# Patient Record
Sex: Female | Born: 1955 | Race: White | Hispanic: No | Marital: Married | Smoking: Former smoker
Health system: Southern US, Community
[De-identification: ages and names within clinical notes are randomized; demographics above are authoritative.]

## PROBLEM LIST (undated history)

## (undated) DIAGNOSIS — M1990 Unspecified osteoarthritis, unspecified site: Secondary | ICD-10-CM

## (undated) DIAGNOSIS — F329 Major depressive disorder, single episode, unspecified: Secondary | ICD-10-CM

## (undated) DIAGNOSIS — F32A Depression, unspecified: Secondary | ICD-10-CM

## (undated) DIAGNOSIS — I509 Heart failure, unspecified: Secondary | ICD-10-CM

## (undated) DIAGNOSIS — K529 Noninfective gastroenteritis and colitis, unspecified: Secondary | ICD-10-CM

## (undated) DIAGNOSIS — I10 Essential (primary) hypertension: Secondary | ICD-10-CM

---

## 1998-12-23 ENCOUNTER — Other Ambulatory Visit: Admission: RE | Admit: 1998-12-23 | Discharge: 1998-12-23 | Payer: Self-pay | Admitting: *Deleted

## 1999-01-14 ENCOUNTER — Encounter (INDEPENDENT_AMBULATORY_CARE_PROVIDER_SITE_OTHER): Payer: Self-pay

## 1999-01-14 ENCOUNTER — Other Ambulatory Visit: Admission: RE | Admit: 1999-01-14 | Discharge: 1999-01-14 | Payer: Self-pay | Admitting: Obstetrics and Gynecology

## 2002-07-15 ENCOUNTER — Emergency Department (HOSPITAL_COMMUNITY): Admission: EM | Admit: 2002-07-15 | Discharge: 2002-07-15 | Payer: Self-pay | Admitting: Emergency Medicine

## 2002-07-15 ENCOUNTER — Encounter: Payer: Self-pay | Admitting: Emergency Medicine

## 2002-12-07 ENCOUNTER — Other Ambulatory Visit: Admission: RE | Admit: 2002-12-07 | Discharge: 2002-12-07 | Payer: Self-pay | Admitting: Family Medicine

## 2003-09-26 ENCOUNTER — Observation Stay (HOSPITAL_COMMUNITY): Admission: EM | Admit: 2003-09-26 | Discharge: 2003-09-27 | Payer: Self-pay | Admitting: Emergency Medicine

## 2003-09-26 ENCOUNTER — Encounter: Admission: RE | Admit: 2003-09-26 | Discharge: 2003-09-26 | Payer: Self-pay | Admitting: Family Medicine

## 2004-03-11 ENCOUNTER — Ambulatory Visit: Payer: Self-pay | Admitting: Family Medicine

## 2004-03-30 ENCOUNTER — Encounter: Admission: RE | Admit: 2004-03-30 | Discharge: 2004-03-30 | Payer: Self-pay | Admitting: Family Medicine

## 2004-04-08 ENCOUNTER — Ambulatory Visit: Payer: Self-pay

## 2004-04-14 ENCOUNTER — Ambulatory Visit: Payer: Self-pay | Admitting: Family Medicine

## 2004-10-05 ENCOUNTER — Ambulatory Visit: Payer: Self-pay | Admitting: Family Medicine

## 2005-06-07 ENCOUNTER — Ambulatory Visit: Payer: Self-pay | Admitting: Family Medicine

## 2005-06-17 ENCOUNTER — Ambulatory Visit: Payer: Self-pay | Admitting: Family Medicine

## 2006-08-15 ENCOUNTER — Telehealth (INDEPENDENT_AMBULATORY_CARE_PROVIDER_SITE_OTHER): Payer: Self-pay | Admitting: *Deleted

## 2006-10-05 ENCOUNTER — Telehealth (INDEPENDENT_AMBULATORY_CARE_PROVIDER_SITE_OTHER): Payer: Self-pay | Admitting: *Deleted

## 2006-12-13 ENCOUNTER — Other Ambulatory Visit: Admission: RE | Admit: 2006-12-13 | Discharge: 2006-12-13 | Payer: Self-pay | Admitting: Family Medicine

## 2007-01-10 ENCOUNTER — Ambulatory Visit (HOSPITAL_COMMUNITY): Admission: RE | Admit: 2007-01-10 | Discharge: 2007-01-10 | Payer: Self-pay | Admitting: Family Medicine

## 2008-09-03 ENCOUNTER — Other Ambulatory Visit: Admission: RE | Admit: 2008-09-03 | Discharge: 2008-09-03 | Payer: Self-pay | Admitting: Family Medicine

## 2009-04-14 ENCOUNTER — Emergency Department (HOSPITAL_COMMUNITY): Admission: EM | Admit: 2009-04-14 | Discharge: 2009-04-14 | Payer: Self-pay | Admitting: Emergency Medicine

## 2009-09-23 ENCOUNTER — Other Ambulatory Visit: Admission: RE | Admit: 2009-09-23 | Discharge: 2009-09-23 | Payer: Self-pay | Admitting: Family Medicine

## 2010-02-28 ENCOUNTER — Encounter: Payer: Self-pay | Admitting: Family Medicine

## 2010-05-03 LAB — COMPREHENSIVE METABOLIC PANEL
ALT: 18 U/L (ref 0–35)
Alkaline Phosphatase: 64 U/L (ref 39–117)
BUN: 12 mg/dL (ref 6–23)
CO2: 21 mEq/L (ref 19–32)
Chloride: 98 mEq/L (ref 96–112)
GFR calc non Af Amer: >60 mL/min (ref >60)
Glucose, Bld: 173 mg/dL — ABNORMAL HIGH (ref 70–99)
Potassium: 3.9 mEq/L (ref 3.5–5.1)
Sodium: 132 mEq/L — ABNORMAL LOW (ref 135–145)
Total Bilirubin: 2.4 mg/dL — ABNORMAL HIGH (ref 0.3–1.2)

## 2010-05-03 LAB — CBC
HCT: 43.2 % (ref 36.0–46.0)
RDW: 13.8 % (ref 11.5–15.5)

## 2010-05-03 LAB — LIPASE, BLOOD: Lipase: 22 U/L (ref 11–59)

## 2010-05-03 LAB — DIFFERENTIAL
Monocytes Relative: 1 % — ABNORMAL LOW (ref 3–12)
Neutro Abs: 13.7 10*3/uL — ABNORMAL HIGH (ref 1.7–7.7)
Neutrophils Relative %: 94 % — ABNORMAL HIGH (ref 43–77)

## 2010-05-03 LAB — POCT CARDIAC MARKERS: Myoglobin, poc: 89.4 ng/mL (ref 12–200)

## 2010-06-03 ENCOUNTER — Encounter (INDEPENDENT_AMBULATORY_CARE_PROVIDER_SITE_OTHER): Payer: BC Managed Care – PPO | Admitting: Cardiothoracic Surgery

## 2010-06-03 DIAGNOSIS — I359 Nonrheumatic aortic valve disorder, unspecified: Secondary | ICD-10-CM

## 2010-06-04 NOTE — Consult Note (Signed)
NEW PATIENT CONSULTATION  Courtney, Miles DOB:  [birth year]                                        June 03, 2010 CHART #:  [id number]  PHYSICIAN REQUESTING CONSULTATION:  Armanda Magic, MD.  PRIMARY CARE PHYSICIAN:  Pam Drown, MD.  REASON FOR CONSULTATION:  Aortic stenosis - subaortic membrane with transvalvular gradient of 65 mmHg by echo.  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  The patient states she has had a cardiac murmur since age 30.  She is followed by intermittent echoes over the years, has been fairly asymptomatic until recently when she has noticed decreased exercise tolerance and dyspnea on exertion especially walking uphill. She denies anginal chest pain, presyncope, orthopnea, or ankle edema. She has never had cardiac arrhythmia.  There is no family history of bicuspid aortic valve disease.  She denies history of endocarditis.  She was evaluated by Dr. Mayford Knife with a chest wall echo in mid March, which shows a outflow tract gradient of 65 mmHg, normal LVEF of 60%, moderate left ventricular hypertrophy, and a calculated aortic valve area of 0.8. There is no significant MR and there was mild AI.  She has not yet been cathed.  The patient denies any active dental problems and has not seen a dentist in some time.  PAST MEDICAL HISTORY: 1. Generalized osteoarthritis. 2. Hypertension. 3. Hyperlipidemia. 4. History of migraines.  HOME MEDICATIONS: 1. Metoprolol succinate 100 mg b.i.d. 2. Diovan 160 mg daily. 3. Celexa one half tablet daily. 4. Mobic 15 mg daily. 5. Vitamin D daily. 6. Tramadol p.r.n. pain. 7. Aspirin 81 mg daily.  ALLERGIES:  SULFA and CELEBREX.  SOCIAL HISTORY:  The patient is married and works part-time at her [employer].  She has never been a smoker and she drinks alcohol occasionally.  FAMILY HISTORY:  Positive for lung cancer.  Positive for MI and hypertension.  REVIEW OF SYSTEMS:  CONSTITUTIONAL:  Review is negative fever or weight loss. ENT:  Review is significant for no dental care for several years, but no complaints.  No difficulty swallowing. Thoracic:  Review is negative for history of thoracic trauma, productive cough, or abnormal chest x-ray. CARDIAC:  Review is positive for long history of cardiac systolic murmur, now diagnosed with severe aortic stenosis with a possible subvalvular membrane. GI:  Review is negative for jaundice or hepatitis or blood per rectum. ENDOCRINE:  Review is negative for diabetes. VASCULAR:  Review is negative for DVT.  She did have a TIA - mini stroke several years ago with some mild left leg weakness and headache which resolved and a CT scan was negative attributed to reaction to Celebrex.  PHYSICAL EXAM:  VITAL SIGNS:  She is 4 feet 10, weighs 170 pounds.  Her blood pressure is 160/94, pulse 60, saturation 95%.  She is anxious but alert and comfortable.  HEENT:  Normocephalic.  NECK:  Without JVD, mass, or bruit.  LYMPHATICS:  Show no palpable adenopathy of the neck. Breath sounds are clear and equal.  There is no thoracic deformity. CARDIAC:  There is a loud upper sternal 3/6 systolic ejection murmur. No diastolic murmur.  No S3 or gallop.  Abdomen:  Soft, nontender. EXTREMITIES:  Reveal no edema, tenderness, or clubbing.  Peripheral pulses are intact.  NEUROLOGIC:  Intact.  LABORATORY DATA:  Reviewed her echo images.  She has a thickened aortic valve with a high amounts of outflow turbulence, LV outflow tract diameter 20 mm, and mild AI.  There is also possible subaortic membrane.  IMPRESSION AND PLAN:  I would recommend the patient proceed with a left and right heart cath and we will arrange for be seen in the dental clinic for dental  evaluation prior to elective valve surgery.  She will return for discussion of the surgery and scheduling a date in approximately 2-3 weeks.  Kerin Perna, M.D. Electronically Signed  PV/MEDQ  D:  06/03/2010  T:  06/04/2010  Job:  [id number]

## 2010-06-17 ENCOUNTER — Other Ambulatory Visit (HOSPITAL_COMMUNITY): Payer: BC Managed Care – PPO | Admitting: Dentistry

## 2010-06-17 DIAGNOSIS — K0530 Chronic periodontitis, unspecified: Secondary | ICD-10-CM

## 2010-06-17 DIAGNOSIS — K045 Chronic apical periodontitis: Secondary | ICD-10-CM

## 2010-06-17 DIAGNOSIS — K08409 Partial loss of teeth, unspecified cause, unspecified class: Secondary | ICD-10-CM

## 2010-06-17 DIAGNOSIS — K083 Retained dental root: Secondary | ICD-10-CM

## 2010-06-23 DIAGNOSIS — K0530 Chronic periodontitis, unspecified: Secondary | ICD-10-CM

## 2010-06-23 DIAGNOSIS — K036 Deposits [accretions] on teeth: Secondary | ICD-10-CM

## 2010-06-24 ENCOUNTER — Ambulatory Visit: Payer: BC Managed Care – PPO | Admitting: Cardiothoracic Surgery

## 2010-06-26 NOTE — Discharge Summary (Signed)
NAMEKERSTI, SCAVONE                   ACCOUNT NO.:  [id number]   MEDICAL RECORD NO.:  [id number]                   PATIENT TYPE:  INP   LOCATION:  4703                                 FACILITY:  [facility]   PHYSICIAN:  Rene Paci, M.D. [facility]          DATE OF BIRTH:  [birth year]   DATE OF ADMISSION:  09/26/2003  DATE OF DISCHARGE:                                 DISCHARGE SUMMARY   DISCHARGE DIAGNOSES:  1. Chest pain, atypical, negative serial enzymes and telemetry for     outpatient Cardiolite.  2. Hypertension.  3. Positive family history of coronary disease.  4. History of mitral regurgitation.  5. History of kidney stones.   DISCHARGE MEDICATIONS:  Discharge medications are as prior to admission and  include:  1. Diovan 160 mg p.o. q.d.  2. Toprol-XL 50 mg p.o. q.d.  3. Baby aspirin p.o. q.d.   HOSPITAL FOLLOWUP:  Scheduled for an outpatient Cardiolite on Tuesday,  August 23, at 12:30 p.m. with [facility]. Then followup with  cardiology for Tuesday, August 30, at 4 p.m. to see Lorin Picket, Dr. Odessa Fleming P.A.  The patient will also call her primary care physician, Dr. ____________ ,  for hospital followup in 1 to 2 weeks to reevaluate blood pressure,  medication control.   CONDITION ON DISCHARGE:  Medically stable. Chest pain free.   HOSPITAL COURSE:  Atypical chest pain. The patient is a 55 year old woman  who on the day prior to admission had experienced chest tightness and  headache with left sided numbness and tingling. She sought care from her  primary care physician who ordered a head scan as well as an EKG which  showed some new ST depression, V4 through V6. Because of these  abnormalities, she was referred for inpatient observation to rule out  cardiac ischemia. She was monitored on telemetry which was negative, and  serial enzymes were negative, third set pending at time of discharge. Repeat  EKG on the following morning showed normal sinus rhythm without  any ST-T  changes. Presuming the third set of enzymes will be negative, she will be  discharged home today with followup Cardiolite to further risk stratify.  This has been discussed with patient. Continue medication modification  control of hypertension. Continue aspirin daily. Fasting lipid profile drawn  on date of discharge. Results pending at time of discharge. Follow with  primary care physician.                                                Rene Paci, M.D. [facility]    VL/MEDQ  D:  09/27/2003  T:  09/28/2003  Job:  [id number]

## 2010-06-26 NOTE — H&P (Signed)
NAME:  Courtney Miles, Courtney Miles                   ACCOUNT NO.:  [id number]   MEDICAL RECORD NO.:  [id number]                   PATIENT TYPE:  INP   LOCATION:                                       FACILITY:  [facility]   PHYSICIAN:  Lelon Perla, M.D.               DATE OF BIRTH:   DATE OF ADMISSION:  09/26/2003  DATE OF DISCHARGE:                                HISTORY & PHYSICAL   ADMISSION DIAGNOSES:  Atypical chest pain, rule out myocardial infarction.   The patient is a 55 year old white female who presented to the office today  complaining of left leg numbness and hand tingling this morning with a  severe headaches.  She denied any chest pain, shortness of breath,  diaphoresis.  The patient states symptoms do not last long but made her  nervous enough to call this morning and come to the office.  The patient  states she is asymptomatic now.   PAST MEDICAL HISTORY:  1. Mitral regurgitation.  2. Hypertension.  3. Kidney stones.   FAMILY HISTORY:  Mother had CAD.  Father had CAD and diabetes.  She has a  sister with diabetes as well.   MEDICATIONS:  1. Diovan 160 mg a day.  2. Baby aspirin 1 a day which was started earlier this week.   ALLERGIES:  SULFA.   REVIEW OF SYSTEMS:  As above.   PHYSICAL EXAMINATION:  VITAL SIGNS:  Blood pressure 160/100; repeat about 20  minutes later was 120/96.  A third blood pressure was done later before she  left and was 136/96.  Weight 184. Temperature 97.8, pulse 104, respirations  20.  GENERAL:  Awake, alert, and oriented, in no acute distress.  HEENT:  Head is normocephalic and atraumatic.  Eyes:  Pupils equal, round,  and reactive to light.  Oropharynx clear.  NECK:  Supple.  No JVD, no bruits.  HEART:  Positive S1 and S2 with systolic ejection murmur 1 to 2/6.  EXTREMITIES:  No edema.  ABDOMEN:  Soft, nontender.  NEUROLOGIC:  Good strength in all four extremities.  DTRs equal and  reactive. Cranial nerves II-XII intact.   LABORATORY  AND X-RAY DATA:  EKG was done which showed new ST depression in  V4 through V6.   CT scan was done without contrast at [facility] this afternoon which was negative.   IMPRESSION AND PLAN:  1. Uncontrolled hypertension.  2. Atypical chest pain with electrocardiogram changes and left upper     extremity numbness.  Question transient ischemic attack.  Rule out     myocardial infarction.  Discussed case with Dr. Graciela Husbands, and patient is to     be admitted to rule out myocardial infarction.  CPKs are to be done.  If     patient rules out, will be done as outpatient per Dr. Graciela Husbands.  The patient     will continue Diovan and aspirin.  3. Orders per Dr. Felicity Coyer.  Lelon Perla, M.D.    Shawnie Dapper  D:  09/26/2003  T:  09/26/2003  Job:  [id number]

## 2010-07-01 ENCOUNTER — Ambulatory Visit (INDEPENDENT_AMBULATORY_CARE_PROVIDER_SITE_OTHER): Payer: BC Managed Care – PPO | Admitting: Cardiothoracic Surgery

## 2010-07-01 DIAGNOSIS — I359 Nonrheumatic aortic valve disorder, unspecified: Secondary | ICD-10-CM

## 2010-07-02 NOTE — Assessment & Plan Note (Signed)
OFFICE VISIT  Courtney Miles, Courtney Miles DOB:  08-05-1955                                        Jul 01, 2010 CHART #:  [id number]  CURRENT PROBLEM: 1. Aortic stenosis with class III symptoms of CHF. 2. Hypertension. 3. Generalized osteoarthritis. 4. Poor dentition, pending dental extraction.  PRESENT ILLNESS:  The patient is a 55 year old Caucasian female who returns for further discussion of aortic valve replacement for aortic stenosis with possible subvalvular membrane as well by transthoracic echo.  She has class III symptoms and is easily fatigued at the end of the day.  She denies any orthopnea or resting pain.  As part of her evaluation in the ER, she underwent a dental evaluation by Dr. Kristin Bruins, who found significant pathology and dental extractions are planned in mid June by an oral surgeon.  She has not yet had a heart cath by Dr. Mayford Knife.  She has been stable since the last office visit 2 weeks ago.  CURRENT MEDICATIONS:  Remain stable, metoprolol 100 mg b.i.d., Diovan 160 mg daily, Celexa one half daily, Mobic 50 mg daily, tramadol 50 mg p.r.n., aspirin 81 mg daily.  PHYSICAL EXAMINATION:  Blood pressure 140/85, pulse 60, respirations 18, saturation 98%.  She is alert but with some anxiety.  Her breath sounds are clear.  Pulses are intact and her cardiac murmur is unchanged, a grade 3/6 systolic ejection murmur.  IMPRESSION:  We had a long discussion about the surgery and answered several of her questions regarding hospitalization, ineffective of the surgery and hospitalization on her underlying anxiety disorder, and panic attacks.  We will ask a nurse navigator to assist her with some information and preoperative teaching to relay some of the anxiety before the procedure and hospitalization.  She will proceed with her dental extractions and she will also need a left and right heart cath. At this point, the surgery is scheduled for sometime later  this summer, but that date has yet not been determined.  I will plan on seeing her back after her dental extractions for discussion of the situation and possible scheduling of the surgery.  Kerin Perna, M.D. Electronically Signed  PV/MEDQ  D:  07/01/2010  T:  07/02/2010  Job:  [id number]  cc:   Armanda Magic, M.D. Pam Drown, M.D.

## 2010-08-03 ENCOUNTER — Ambulatory Visit (INDEPENDENT_AMBULATORY_CARE_PROVIDER_SITE_OTHER): Payer: BC Managed Care – PPO | Admitting: Cardiothoracic Surgery

## 2010-08-03 DIAGNOSIS — I359 Nonrheumatic aortic valve disorder, unspecified: Secondary | ICD-10-CM

## 2010-08-04 NOTE — Assessment & Plan Note (Signed)
OFFICE VISIT  Courtney Miles, Courtney Miles DOB:  [birth year]                                        August 03, 2010 CHART #:  [id number]  CURRENT PROBLEMS: 1. Aortic stenosis. 2. Status post dental extractions for necrotic teeth. 3. Pending right and left heart cath August 21, 2010.  PRESENT ILLNESS:  Courtney Miles is a 55 year old female with long history of aortic stenosis, murmur, and progressive transvalvular gradient by serial echoes who is being prepared for aortic valve replacement with bioprosthetic valve.  She recently was involving with a music festival and has upper respiratory - sinusitis type situation with some mildly productive cough for which she will take Humibid.  She denies fever or headache.  She feels drained, but without orthopnea, PND, or chest pain.  On exam, she has a regular rhythm with blood pressure 150/90, saturation 98%.  Lungs:  Clear.  She has a high-pitched systolic murmur.  No pedal edema.  PLAN:  The patient will be prepared for aortic valve replacement on September 14, 2010, and I will review this, also her cardiac cath by Dr. Mayford Knife, and called her to determine if any findings of cath, would change her operation from aortic valve replacement.  She will probably need a CT scan of the chest with contrast to assess her aorta prior to surgery and that can be done when she comes in for her pre-assessment. She will continue her current medications including metoprolol, Diovan, Celexa, Mobic, tramadol, and aspirin.  Kerin Perna, M.D. Electronically Signed  PV/MEDQ  D:  08/03/2010  T:  08/04/2010  Job:  [id number]

## 2010-08-25 ENCOUNTER — Inpatient Hospital Stay (HOSPITAL_BASED_OUTPATIENT_CLINIC_OR_DEPARTMENT_OTHER)
Admission: RE | Admit: 2010-08-25 | Discharge: 2010-08-25 | Disposition: A | Discharge status: Home / Self Care | Payer: BC Managed Care – PPO | Source: Ambulatory Visit | Attending: Cardiology | Admitting: Cardiology

## 2010-08-25 DIAGNOSIS — I2789 Other specified pulmonary heart diseases: Secondary | ICD-10-CM | POA: Insufficient documentation

## 2010-08-25 DIAGNOSIS — I359 Nonrheumatic aortic valve disorder, unspecified: Secondary | ICD-10-CM | POA: Insufficient documentation

## 2010-08-25 LAB — POCT I-STAT 3, ART BLOOD GAS (G3+)
Bicarbonate: 26.3 mEq/L — ABNORMAL HIGH (ref 20.0–24.0)
O2 Saturation: 97 %
TCO2: 28 mmol/L (ref 0–100)
pCO2 arterial: 44.6 mmHg (ref 35.0–45.0)
pO2, Arterial: 89 mmHg (ref 80.0–100.0)

## 2010-08-25 LAB — POCT I-STAT 3, VENOUS BLOOD GAS (G3P V)
Acid-base deficit: 3 mmol/L — ABNORMAL HIGH (ref 0.0–2.0)
Bicarbonate: 23.3 mEq/L (ref 20.0–24.0)
O2 Saturation: 67 %
O2 Saturation: 71 %
TCO2: 28 mmol/L (ref 0–100)
pCO2, Ven: 44.2 mmHg — ABNORMAL LOW (ref 45.0–50.0)
pH, Ven: 7.329 — ABNORMAL HIGH (ref 7.250–7.300)
pO2, Ven: 37 mmHg (ref 30.0–45.0)

## 2010-08-27 NOTE — Cardiovascular Report (Signed)
Courtney Miles, Courtney Miles             ACCOUNT NO.:  [id number]  MEDICAL RECORD NO.:  [id number]  LOCATION:                                 FACILITY:  PHYSICIAN:  Armanda Magic, M.D.     DATE OF BIRTH:  [birth year]  DATE OF PROCEDURE:  08/25/2010 DATE OF DISCHARGE:                           CARDIAC CATHETERIZATION   REFERRING PHYSICIAN:  Pam Drown, MD  PROCEDURES: 1. Right heart catheterization. 2. Coronary angiography.  OPERATOR:  Armanda Magic, MD  INDICATIONS:  Aortic stenosis in the subaortic membrane.  COMPLICATIONS:  None.  INTRAVENOUS ACCESS:  Via right femoral artery, 4-French sheath; right femoral vein, 5-French sheath.  MEDICATIONS:  Versed 1 mg and fentanyl 25 mcg.  This is a 55 year old female who was recently diagnosed with a heart murmur and 2-D echocardiogram showed severe subaortic stenosis/aortic stenosis.  The patient was evaluated by Dr. Kathlee Nations Trigt and they planned to proceed with aortic valve replacement.  She now presents to undergo cardiac catheterization before her heart surgery.  The patient was brought to the Cardiac Catheterization Lab in a fasting nonsedated state.  Informed consent was obtained.  The patient was connected to continuous heart rate and pulse oximetry monitoring and intermittent blood pressure monitoring.  The right groin was prepped and draped in a sterile fashion.  Xylocaine 1% was used for local anesthesia.  Using modified Seldinger technique, a 4-French sheath was placed in the right femoral artery.  Using modified Seldinger technique, a 5-French sheath was placed in the right femoral vein.  Under fluoroscopic guidance, a Swan-Ganz catheter was placed in the balloon flotation in the right atrium.  The right atrial pressure and O2 saturations were obtained.  The catheter was then advanced in the right ventricle and right ventricular pressure was obtained.  The catheter was then advanced into the pulmonary artery and  allowed to float into the pulmonary capillary wedge position.  Pulmonary capillary wedge pressure was obtained.  The balloon was then deflated and the catheter was pulled back into the main pulmonary artery.  Pulmonary artery pressure and O2 saturations were obtained.  Subsequently, cardiac outputs were performed using the thermodilution with 3 separate injections with 10 mL of saline each.  The catheter was then removed under fluoroscopic guidance.  Under fluoroscopic guidance, a 4-French JL-4 catheter was placed in the left coronary artery.  Multiple cine films were taken at 30-degree RAO and 40-degree LAO views.  This catheter was then exchanged over a guidewire.  A 4-French JR-4 catheter was successfully engaged to the right coronary ostium.  Multiple cine films were taken at 30-degree RAO and 40-degree LAO views.  This catheter was then removed over a guidewire.  At the end of the procedure, all catheters and sheaths were removed.  Manual compression was performed until adequate hemostasis was obtained.  The patient was transferred back to the room in stable condition.  RESULTS:  Right heart cath data:  The right atrial pressure was 15/12 with a mean of 11 mmHg.  RV pressure 51/17 with a mean of 16 mmHg.  PA pressure 45/20 with a mean of 30 mmHg.  Pulmonary capillary wedge 30/29 with a mean of 22 mmHg.  O2 saturations:  Aorta 97%, PA 67%, RA 71%. Cardiac output by Fick 3.9 and by thermodilution 3.4.  Cardiac index by Fick 2.3 and by thermodilution 2.0  Left heart cath data:  The left main coronary artery is very short and immediately bifurcates into the left anterior descending artery and left circumflex artery.  The left anterior descending artery is widely patent throughout its course of the apex.  It gives rise to a small first diagonal branch which is widely patent and a second moderate-sized diagonal branch which is widely patent and bifurcates distally and 2 daughter  vessels.  Left circumflex is widely patent throughout its course in the AV groove giving rise to one large first obtuse marginal branch which is widely patent.  The circumflex traverses the AV groove and then gives rise to a second obtuse marginal branch as well as a posterior descending artery branch, both of which are widely patent.  This is a left dominant system.  The posterior descending artery is widely patent.  The right coronary artery is nondominant and widely patent.  ASSESSMENT: 1. Normal coronary arteries with a left dominant system. 2. Mild to moderate pulmonary hypertension by right heart cath data. 3. Normal left ventricular function by 2-D echocardiogram, ejection     fraction 65-70% 4. Subaortic membrane with severe aortic stenosis.  PLAN:  Discharge home after IV fluid and bedrest.  Aortic valve replacement in near future by Dr. Kathlee Nations Trigt.     Armanda Magic, M.D.     TT/MEDQ  D:  08/25/2010  T:  08/26/2010  Job:  [id number]  cc:   Kerin Perna, M.D.  Electronically Signed by Armanda Magic M.D. on 08/27/2010 09:17:57 AM

## 2010-09-01 ENCOUNTER — Other Ambulatory Visit: Payer: Self-pay | Admitting: Cardiothoracic Surgery

## 2010-09-01 DIAGNOSIS — I059 Rheumatic mitral valve disease, unspecified: Secondary | ICD-10-CM

## 2010-09-10 ENCOUNTER — Ambulatory Visit (HOSPITAL_COMMUNITY)
Admission: RE | Admit: 2010-09-10 | Discharge: 2010-09-10 | Disposition: A | Discharge status: Home / Self Care | Payer: BC Managed Care – PPO | Source: Ambulatory Visit | Attending: Cardiothoracic Surgery | Admitting: Cardiothoracic Surgery

## 2010-09-10 ENCOUNTER — Encounter (HOSPITAL_COMMUNITY): Payer: Self-pay

## 2010-09-10 ENCOUNTER — Other Ambulatory Visit: Payer: Self-pay | Admitting: Cardiothoracic Surgery

## 2010-09-10 ENCOUNTER — Encounter (HOSPITAL_COMMUNITY)
Admission: RE | Admit: 2010-09-10 | Discharge: 2010-09-10 | Disposition: A | Discharge status: Home / Self Care | Payer: BC Managed Care – PPO | Source: Ambulatory Visit | Attending: Cardiothoracic Surgery | Admitting: Cardiothoracic Surgery

## 2010-09-10 ENCOUNTER — Ambulatory Visit: Payer: Self-pay | Admitting: Cardiothoracic Surgery

## 2010-09-10 DIAGNOSIS — Z01818 Encounter for other preprocedural examination: Secondary | ICD-10-CM | POA: Insufficient documentation

## 2010-09-10 DIAGNOSIS — Z01811 Encounter for preprocedural respiratory examination: Secondary | ICD-10-CM | POA: Insufficient documentation

## 2010-09-10 DIAGNOSIS — I059 Rheumatic mitral valve disease, unspecified: Secondary | ICD-10-CM

## 2010-09-10 DIAGNOSIS — I350 Nonrheumatic aortic (valve) stenosis: Secondary | ICD-10-CM

## 2010-09-10 DIAGNOSIS — Z01810 Encounter for preprocedural cardiovascular examination: Secondary | ICD-10-CM

## 2010-09-10 DIAGNOSIS — Z01812 Encounter for preprocedural laboratory examination: Secondary | ICD-10-CM | POA: Insufficient documentation

## 2010-09-10 DIAGNOSIS — I517 Cardiomegaly: Secondary | ICD-10-CM | POA: Insufficient documentation

## 2010-09-10 DIAGNOSIS — I2510 Atherosclerotic heart disease of native coronary artery without angina pectoris: Secondary | ICD-10-CM

## 2010-09-10 DIAGNOSIS — I10 Essential (primary) hypertension: Secondary | ICD-10-CM | POA: Insufficient documentation

## 2010-09-10 DIAGNOSIS — E049 Nontoxic goiter, unspecified: Secondary | ICD-10-CM | POA: Insufficient documentation

## 2010-09-10 DIAGNOSIS — K449 Diaphragmatic hernia without obstruction or gangrene: Secondary | ICD-10-CM | POA: Insufficient documentation

## 2010-09-10 DIAGNOSIS — R0602 Shortness of breath: Secondary | ICD-10-CM | POA: Insufficient documentation

## 2010-09-10 HISTORY — DX: Essential (primary) hypertension: I10

## 2010-09-10 LAB — URINE MICROSCOPIC-ADD ON

## 2010-09-10 LAB — TYPE AND SCREEN
ABO/RH(D): A POS
Antibody Screen: NEGATIVE

## 2010-09-10 LAB — URINALYSIS, ROUTINE W REFLEX MICROSCOPIC
Bilirubin Urine: NEGATIVE
Glucose, UA: NEGATIVE mg/dL
Hgb urine dipstick: NEGATIVE
Ketones, ur: NEGATIVE mg/dL
Nitrite: NEGATIVE
Protein, ur: NEGATIVE mg/dL
Specific Gravity, Urine: >1.046 — ABNORMAL HIGH (ref 1.005–1.030)
Urobilinogen, UA: 0.2 mg/dL (ref 0.0–1.0)
pH: 6.5 (ref 5.0–8.0)

## 2010-09-10 LAB — COMPREHENSIVE METABOLIC PANEL
ALT: 15 U/L (ref 0–35)
AST: 16 U/L (ref 0–37)
Albumin: 4 g/dL (ref 3.5–5.2)
Alkaline Phosphatase: 74 U/L (ref 39–117)
BUN: 15 mg/dL (ref 6–23)
CO2: 21 mEq/L (ref 19–32)
Calcium: 9.4 mg/dL (ref 8.4–10.5)
Chloride: 100 mEq/L (ref 96–112)
Creatinine, Ser: 0.55 mg/dL (ref 0.50–1.10)
GFR calc Af Amer: >60 mL/min (ref >60)
GFR calc non Af Amer: >60 mL/min (ref >60)
Glucose, Bld: 106 mg/dL — ABNORMAL HIGH (ref 70–99)
Potassium: 4.4 mEq/L (ref 3.5–5.1)
Sodium: 135 mEq/L (ref 135–145)
Total Bilirubin: 0.4 mg/dL (ref 0.3–1.2)
Total Protein: 6.9 g/dL (ref 6.0–8.3)

## 2010-09-10 LAB — HEMOGLOBIN A1C
Hgb A1c MFr Bld: 5.4 % (ref <5.7)
Mean Plasma Glucose: 108 mg/dL (ref <117)

## 2010-09-10 LAB — BLOOD GAS, ARTERIAL
Acid-Base Excess: 0.1 mmol/L (ref 0.0–2.0)
Bicarbonate: 24 mEq/L (ref 20.0–24.0)
Drawn by: 344381
FIO2: 0.21 %
O2 Saturation: 98.3 %
Patient temperature: 98.6
TCO2: 25.2 mmol/L (ref 0–100)
pCO2 arterial: 37.7 mmHg (ref 35.0–45.0)
pH, Arterial: 7.42 — ABNORMAL HIGH (ref 7.350–7.400)
pO2, Arterial: 103 mmHg — ABNORMAL HIGH (ref 80.0–100.0)

## 2010-09-10 LAB — CBC
HCT: 40.4 % (ref 36.0–46.0)
Hemoglobin: 13.6 g/dL (ref 12.0–15.0)
MCH: 29.2 pg (ref 26.0–34.0)
MCHC: 33.7 g/dL (ref 30.0–36.0)
MCV: 86.7 fL (ref 78.0–100.0)
Platelets: 272 10*3/uL (ref 150–400)
RBC: 4.66 MIL/uL (ref 3.87–5.11)
RDW: 13.4 % (ref 11.5–15.5)
WBC: 8.2 10*3/uL (ref 4.0–10.5)

## 2010-09-10 LAB — PROTIME-INR
INR: 0.98 (ref 0.00–1.49)
Prothrombin Time: 13.2 seconds (ref 11.6–15.2)

## 2010-09-10 LAB — ABO/RH: ABO/RH(D): A POS

## 2010-09-10 LAB — APTT: aPTT: 30 seconds (ref 24–37)

## 2010-09-10 LAB — SURGICAL PCR SCREEN
MRSA, PCR: NEGATIVE
Staphylococcus aureus: NEGATIVE

## 2010-09-10 MED ORDER — IOHEXOL 300 MG/ML  SOLN: 100.0000 mL | Freq: Once | INTRAMUSCULAR | Status: DC | PRN

## 2010-09-11 ENCOUNTER — Ambulatory Visit (INDEPENDENT_AMBULATORY_CARE_PROVIDER_SITE_OTHER): Payer: BC Managed Care – PPO

## 2010-09-11 ENCOUNTER — Ambulatory Visit: Payer: Self-pay | Admitting: Cardiothoracic Surgery

## 2010-09-11 DIAGNOSIS — I359 Nonrheumatic aortic valve disorder, unspecified: Secondary | ICD-10-CM

## 2010-09-12 NOTE — H&P (Signed)
HISTORY AND PHYSICAL EXAMINATION  September 11, 2010  Re:  Courtney Miles, Courtney Miles       DOB:  19-May-1955  ADMISSION DIAGNOSES: 1. Severe aortic stenosis with class III congestive heart failure. 2. 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 3. Hypertension. 4. Anxiety/depression.  HISTORY OF PRESENT ILLNESS:  The patient is a 55 year old Caucasian female, nonsmoker, who has been evaluated over the past 3 months for severe aortic stenosis.  She has had a long history of cardiac murmur since age 9 and serial echoes have shown minimal transvalvular gradient across the aortic valve.  More recently, she has had dyspnea on exertion, decreased exercise tolerance, weakness, and some dizziness. There is no resting angina, shortness of breath, orthopnea, or PND. There is no arrhythmia or syncope.  There is no known family history of bicuspid aortic valve disease.  A 2-D echo performed by Dr. Armanda Magic demonstrated a gradient across the aortic valve with 65 mmHg with EF of 60%, moderate LVH.  The calculated aortic valve area was 0.7.  There is no significant AI or MR.  She at that point had a dental abscess and underwent antibiotic prophylaxis and dental extraction.  She then returned for a cardiac catheterization performed on August 26, 2010, which showed normal coronaries with moderate pulmonary hypertension with PA pressures of 45/20.  She was in sinus rhythm.  A subsequent CTA of the thoracic aorta showed no evidence of aneurysm, coarctation, or hematoma. She had no evidence of pulmonary emboli or pulmonary parenchymal nodules.  There is no abnormal mediastinal adenopathy.  The left lobe of the thyroid was moderately enlarged and ultrasound was recommended at some point.  Based on the results of the above studies, the patient has a clinical diagnosis of severe aortic stenosis with possible subvalvular obstruction as well and aortic valve  replacement has been recommended with the patient on several occasions and she wishes a bioprosthetic valve if it is possible.  She understands due to size and limitations of the aortic orifices, a mechanical valve may be necessary as well.  Recently, she denies any symptoms of upper respiratory sinus congestion. She still is having progressive dyspnea on exertion, decreasing exercise tolerance, and is ready to have the surgery.  HOME MEDICATIONS: 1. Metoprolol succinate 100 mg b.i.d. 2. Diovan 160 mg daily. 3. Celexa 1/2 tablet daily. 4. Mobic 15 mg daily. 5. Vitamin D. 6. Tramadol p.r.n. pain. 7. Aspirin 81 mg daily.  ALLERGIES:  SULFA and CELEBREX.  PAST MEDICAL HISTORY: 1. Generalized osteoarthritis. 2. Hypertension. 3. Hyperlipidemia. 4. History of migraines. 5. Anxiety/depression.  SOCIAL HISTORY:  The patient is married and works part-time at her [employer].  She has never been a smoker and drinks alcohol socially.  FAMILY HISTORY:  Positive for lung cancer, positive for CAD and MI.  REVIEW OF SYSTEMS:  CONSTITUTIONAL:  Negative fever or weight loss. ENT:  Significant for undergoing previous dental work in the spring in preparation for aortic valve replacement.  She denies any current dental problems or difficulty swallowing. THORACIC:  Negative history of abnormal chest x-ray, thoracic trauma, or productive cough or hemoptysis.  Her CT scan of the chest was basically unremarkable. CARDIAC:  Positive for long history of cardiac systolic murmur, now being diagnosed as severe aortic stenosis. GI:  Negative for hepatitis, jaundice, blood per rectum. ENDOCRINE:  Negative for diabetes. VASCULAR:  Negative for DVT.  The patient did have a TIA - mini stroke 30 years  ago, which was associated with taking Celebrex and could have been an allergic reaction as the CT scan of the head was negative.  PHYSICAL EXAMINATION:  VITAL SIGNS:  The patient is 4 feet 10,  weighs 167 pounds, blood pressure is 120/80, pulse 60, saturation 95%.  She is afebrile.  GENERAL APPEARANCE:  A middle-aged Caucasian female, no acute distress.  HEENT:  Normocephalic.  Pupils are equal.  Dentition good. NECK:  Without JVD, mass, or bruit.  LYMPHATICS:  No palpable adenopathy of the neck.  CHEST: Without deformity.  Breath sounds are clear and equal.  CARDIAC:  A loud high-pitched 3/6 systolic ejection murmur without diastolic component.  There is no S3 gallop and the rhythm is regular.  ABDOMEN:  Soft, obese, nontender without pulsatile mass. EXTREMITIES:  No edema, tenderness, or clubbing.  Peripheral pulses are intact.  NEUROLOGIC:  Intact.  LABORATORY DATA:  Her echocardiogram was performed earlier this spring at [facility], and I reviewed the images which showed a thickened aortic valve with high amounts of outflow tract turbulence.  The LV outflow tract diameters 20 mm and there is mild AI.  There is also a possible subaortic membrane or septal ridge of the hypertrophy.  I reviewed with the patient today her more recent studies including the coronary arteriogram, CT scan of the chest, or chest x-ray preoperatively, and her carotid Doppler studies, which showed no significant carotid disease.  PLAN:  The patient will be prepared for aortic valve replacement with a probable bioprosthetic valve on Monday, September 14, 2010.  At that time, we will also carefully examine the subvalvular outflow tract and remove any obstructive component in that part of her aortic anatomy as well.  I reviewed the details of the surgery with the patient including the use of general anesthesia, location of the surgical incisions, use of cardiopulmonary bypass, and expected recovery.  She understands the potential risks of bleeding, blood transfusion requirement, pacemaker dependent and permanent pacemaker requirement, and possibility that a mechanical valve may be needed if her  annulus is too small for a bioprosthetic valve.  She also understands the risk of stroke and death. After reviewing these issues, she demonstrates her understanding and agreed to proceed with the surgery under what I felt was an informed consent.  Kerin Perna, M.D. Electronically Signed  PV/MEDQ  D:  09/11/2010  T:  09/12/2010  Job:  [id number]

## 2010-09-14 ENCOUNTER — Ambulatory Visit (HOSPITAL_COMMUNITY)
Admission: RE | Admit: 2010-09-14 | Discharge: 2010-09-14 | Disposition: A | Discharge status: Home / Self Care | Payer: BC Managed Care – PPO | Source: Ambulatory Visit | Attending: Cardiothoracic Surgery | Admitting: Cardiothoracic Surgery

## 2010-09-14 ENCOUNTER — Inpatient Hospital Stay (HOSPITAL_COMMUNITY): Payer: BC Managed Care – PPO

## 2010-09-14 DIAGNOSIS — I10 Essential (primary) hypertension: Secondary | ICD-10-CM | POA: Insufficient documentation

## 2010-09-14 DIAGNOSIS — Z8673 Personal history of transient ischemic attack (TIA), and cerebral infarction without residual deficits: Secondary | ICD-10-CM | POA: Insufficient documentation

## 2010-09-14 DIAGNOSIS — I359 Nonrheumatic aortic valve disorder, unspecified: Secondary | ICD-10-CM | POA: Insufficient documentation

## 2010-09-14 DIAGNOSIS — I509 Heart failure, unspecified: Secondary | ICD-10-CM | POA: Insufficient documentation

## 2010-09-14 DIAGNOSIS — Z5309 Procedure and treatment not carried out because of other contraindication: Secondary | ICD-10-CM | POA: Insufficient documentation

## 2010-09-14 DIAGNOSIS — K219 Gastro-esophageal reflux disease without esophagitis: Secondary | ICD-10-CM | POA: Insufficient documentation

## 2010-09-14 LAB — POCT I-STAT 4, (NA,K, GLUC, HGB,HCT)
Glucose, Bld: 105 mg/dL — ABNORMAL HIGH (ref 70–99)
HCT: 40 % (ref 36.0–46.0)
Hemoglobin: 13.6 g/dL (ref 12.0–15.0)
Potassium: 3.7 mEq/L (ref 3.5–5.1)
Sodium: 139 mEq/L (ref 135–145)

## 2010-09-16 ENCOUNTER — Other Ambulatory Visit (HOSPITAL_COMMUNITY): Payer: Self-pay | Admitting: Cardiology

## 2010-09-16 DIAGNOSIS — I350 Nonrheumatic aortic (valve) stenosis: Secondary | ICD-10-CM

## 2010-09-16 NOTE — Op Note (Signed)
NAMEELEENA, Courtney Miles             ACCOUNT NO.:  [id number]  MEDICAL RECORD NO.:  [id number]  LOCATION:  2022                         FACILITY:  [facility]  PHYSICIAN:  Jake Bathe, MD      DATE OF BIRTH:  January 31, 1956  DATE OF PROCEDURE:  09/14/2010 DATE OF DISCHARGE:  09/14/2010                              OPERATIVE REPORT   PROCEDURE:  Transesophageal echocardiogram. Evaluation of subaortic membrane.  SURGEON:  Britt Petroni C. Anne Fu, MD  PROCEDURE IN DETAIL:  I was asked at the request of Dr. Armanda Magic, Courtney Miles's cardiologist as well as Dr. Kathlee Nations Trigt of Cardiothoracic Surgery to assist with the interpretation of a transesophageal echocardiogram performed under anesthesia prior to open heart procedure.  The anesthesiologist on the case had already inserted the probe and performed all basic images and measurements and there was no clear subaortic membrane identified.  At this point, Dr. Donata Clay called Dr. Mayford Knife who was not in the hospital at that point.  She then asked me to assist Dr. Donata Clay.  The transesophageal echocardiogram probe as stated above was already inserted and I was able to carefully manipulate and view all standard images with careful focus on the left ventricular outflow tract and aortic root.  There was no clear anatomic manifestation of the subaortic membrane identified.  There was very subtle turbulent flow on the ventricular surface of the left coronary cusp.  As was seen on prior echocardiogram, there was mild aortic regurgitation which was a central jet.  Courtney aortic valve appeared only to be mildly restricted.  There was no evidence of severe aortic stenosis.  Courtney Miles, echo technician, assisted with transthoracic echocardiogram which was done to better quantify Courtney aortic outflow tract velocity.  This was performed in the apical views.  Courtney left ventricular aortic outflow tract velocity was at a maximum of 3.0 L/sec. According to Dr. Norris Cross  echo report from the office setting, it was 3.8 L/sec at that time.  After careful manipulation of the transesophageal echocardiogram probe looking for any possible anatomic feature of the subaortic membrane, this was not clearly demonstrated.  These findings were discussed with Dr. Donata Clay who was present during the procedure.  Please see Dr. Zenaida Niece Trigt's note for details, the aortic valve procedure was aborted at this point.  A lengthy discussion was held with Courtney Miles and Miles led by Dr. Donata Clay.  We carefully explained the echocardiographic findings.  We laid out that Dr. Mayford Knife was notified in the operating room of the echocardiogram findings and that the next step will be to obtain further imaging with either cardiac MRI or cardiac CT. Dr. Mayford Knife stated that she would talk with Dr. Marca Ancona, one of our imaging specialists in cardiology about which study would be best for Courtney to illustrate the possibility of a subaortic membrane.  It was explained to the Miles that Dr. Mayford Knife in coordination with Dr. Shirlee Latch would be making further decisions on further testing.  The question was asked by the Miles if replacing the valve because of the aortic regurgitation or aortic stenosis would help Courtney overall symptoms.  At this point, given Courtney only mild aortic regurgitation  and moderate elevation and left ventricular outflow tract velocity, replacement of the valve for this sole purpose would not be indicated.  This was explained carefully by Dr. Donata Clay.  The question now remains if a subaortic membrane of hemodynamic significance is still present.  Further testing and evaluation per Dr. Mayford Knife.     Jake Bathe, MD     MCS/MEDQ  D:  09/15/2010  T:  09/16/2010  Job:  [id number]  Electronically Signed by Donato Schultz MD on 09/16/2010 06:19:56 AM

## 2010-09-18 ENCOUNTER — Ambulatory Visit (HOSPITAL_COMMUNITY)
Admission: RE | Admit: 2010-09-18 | Discharge: 2010-09-18 | Disposition: A | Discharge status: Home / Self Care | Payer: BC Managed Care – PPO | Source: Ambulatory Visit | Attending: Cardiology | Admitting: Cardiology

## 2010-09-18 ENCOUNTER — Other Ambulatory Visit (HOSPITAL_COMMUNITY): Payer: BC Managed Care – PPO

## 2010-09-18 DIAGNOSIS — I359 Nonrheumatic aortic valve disorder, unspecified: Secondary | ICD-10-CM | POA: Insufficient documentation

## 2010-09-18 DIAGNOSIS — I350 Nonrheumatic aortic (valve) stenosis: Secondary | ICD-10-CM

## 2010-09-18 MED ORDER — GADOBENATE DIMEGLUMINE 529 MG/ML IV SOLN
32.0000 mL | Freq: Once | INTRAVENOUS | Status: AC
  Administered 2010-09-18: 32 mL via INTRAVENOUS

## 2012-03-17 ENCOUNTER — Other Ambulatory Visit: Payer: Self-pay | Admitting: Family Medicine

## 2012-03-17 DIAGNOSIS — Z1231 Encounter for screening mammogram for malignant neoplasm of breast: Secondary | ICD-10-CM

## 2012-03-31 ENCOUNTER — Encounter (HOSPITAL_COMMUNITY): Payer: Self-pay

## 2012-03-31 ENCOUNTER — Inpatient Hospital Stay (HOSPITAL_COMMUNITY)
Admission: EM | Admit: 2012-03-31 | Discharge: 2012-04-04 | DRG: 024 | Disposition: A | Discharge status: Home / Self Care | Payer: BC Managed Care – PPO | Attending: Internal Medicine | Admitting: Internal Medicine

## 2012-03-31 ENCOUNTER — Inpatient Hospital Stay (HOSPITAL_COMMUNITY): Payer: BC Managed Care – PPO

## 2012-03-31 ENCOUNTER — Emergency Department (HOSPITAL_COMMUNITY): Payer: BC Managed Care – PPO

## 2012-03-31 DIAGNOSIS — A088 Other specified intestinal infections: Secondary | ICD-10-CM | POA: Diagnosis present

## 2012-03-31 DIAGNOSIS — Q254 Congenital malformation of aorta unspecified: Secondary | ICD-10-CM

## 2012-03-31 DIAGNOSIS — R9431 Abnormal electrocardiogram [ECG] [EKG]: Secondary | ICD-10-CM

## 2012-03-31 DIAGNOSIS — G40909 Epilepsy, unspecified, not intractable, without status epilepticus: Secondary | ICD-10-CM

## 2012-03-31 DIAGNOSIS — R197 Diarrhea, unspecified: Secondary | ICD-10-CM

## 2012-03-31 DIAGNOSIS — R4182 Altered mental status, unspecified: Secondary | ICD-10-CM

## 2012-03-31 DIAGNOSIS — E876 Hypokalemia: Secondary | ICD-10-CM

## 2012-03-31 DIAGNOSIS — R1110 Vomiting, unspecified: Secondary | ICD-10-CM

## 2012-03-31 DIAGNOSIS — I10 Essential (primary) hypertension: Secondary | ICD-10-CM

## 2012-03-31 DIAGNOSIS — E871 Hypo-osmolality and hyponatremia: Secondary | ICD-10-CM

## 2012-03-31 DIAGNOSIS — B349 Viral infection, unspecified: Secondary | ICD-10-CM

## 2012-03-31 DIAGNOSIS — R569 Unspecified convulsions: Principal | ICD-10-CM | POA: Diagnosis present

## 2012-03-31 DIAGNOSIS — G9341 Metabolic encephalopathy: Secondary | ICD-10-CM | POA: Diagnosis present

## 2012-03-31 HISTORY — DX: Depression, unspecified: F32.A

## 2012-03-31 HISTORY — DX: Major depressive disorder, single episode, unspecified: F32.9

## 2012-03-31 HISTORY — DX: Unspecified osteoarthritis, unspecified site: M19.90

## 2012-03-31 LAB — DIFFERENTIAL
Basophils Absolute: 0 10*3/uL (ref 0.0–0.1)
Basophils Relative: 0 % (ref 0–1)
Eosinophils Absolute: 0 10*3/uL (ref 0.0–0.7)
Eosinophils Relative: 0 % (ref 0–5)
Monocytes Absolute: 1 10*3/uL (ref 0.1–1.0)
Monocytes Relative: 7 % (ref 3–12)

## 2012-03-31 LAB — APTT: aPTT: 26 seconds (ref 24–37)

## 2012-03-31 LAB — COMPREHENSIVE METABOLIC PANEL
AST: 34 U/L (ref 0–37)
Albumin: 4.2 g/dL (ref 3.5–5.2)
BUN: 11 mg/dL (ref 6–23)
Calcium: 9.9 mg/dL (ref 8.4–10.5)
Chloride: 88 mEq/L — ABNORMAL LOW (ref 96–112)
Creatinine, Ser: 0.78 mg/dL (ref 0.50–1.10)
Total Bilirubin: 0.6 mg/dL (ref 0.3–1.2)

## 2012-03-31 LAB — POCT I-STAT TROPONIN I: Troponin i, poc: 0.01 ng/mL (ref 0.00–0.08)

## 2012-03-31 LAB — PHOSPHORUS: Phosphorus: 4.6 mg/dL (ref 2.3–4.6)

## 2012-03-31 LAB — TROPONIN I: Troponin I: <0.3 ng/mL (ref <0.30)

## 2012-03-31 LAB — CREATININE, SERUM
GFR calc Af Amer: >90 mL/min (ref >90)
GFR calc non Af Amer: >90 mL/min (ref >90)

## 2012-03-31 LAB — CBC
HCT: 44.1 % (ref 36.0–46.0)
Hemoglobin: 15.8 g/dL — ABNORMAL HIGH (ref 12.0–15.0)
MCH: 30.7 pg (ref 26.0–34.0)
MCHC: 35.8 g/dL (ref 30.0–36.0)
MCV: 85.8 fL (ref 78.0–100.0)
RDW: 13.4 % (ref 11.5–15.5)

## 2012-03-31 LAB — MAGNESIUM: Magnesium: 1.7 mg/dL (ref 1.5–2.5)

## 2012-03-31 LAB — AMMONIA: Ammonia: 45 umol/L (ref 11–60)

## 2012-03-31 MED ORDER — SODIUM CHLORIDE 0.9 % IV SOLN
1000.0000 mg | Freq: Two times a day (BID) | INTRAVENOUS | Status: DC
  Administered 2012-03-31: 1000 mg via INTRAVENOUS
  Filled 2012-03-31 (×3): qty 10

## 2012-03-31 MED ORDER — VANCOMYCIN HCL IN DEXTROSE 1-5 GM/200ML-% IV SOLN
1000.0000 mg | Freq: Two times a day (BID) | INTRAVENOUS | Status: DC
  Administered 2012-03-31 – 2012-04-01 (×2): 1000 mg via INTRAVENOUS
  Filled 2012-03-31 (×3): qty 200

## 2012-03-31 MED ORDER — POTASSIUM CHLORIDE 10 MEQ/100ML IV SOLN
10.0000 meq | Freq: Once | INTRAVENOUS | Status: AC
  Administered 2012-03-31: 10 meq via INTRAVENOUS
  Filled 2012-03-31: qty 100

## 2012-03-31 MED ORDER — LORAZEPAM 2 MG/ML IJ SOLN
INTRAMUSCULAR | Status: AC
  Filled 2012-03-31: qty 1

## 2012-03-31 MED ORDER — DEXTROSE 5 % IV SOLN
2.0000 g | Freq: Two times a day (BID) | INTRAVENOUS | Status: DC
  Administered 2012-03-31 – 2012-04-01 (×2): 2 g via INTRAVENOUS
  Filled 2012-03-31 (×3): qty 2

## 2012-03-31 MED ORDER — LEVETIRACETAM 500 MG PO TABS
1000.0000 mg | ORAL_TABLET | Freq: Two times a day (BID) | ORAL | Status: DC
  Filled 2012-03-31: qty 2

## 2012-03-31 MED ORDER — SODIUM CHLORIDE 0.9 % IJ SOLN
3.0000 mL | Freq: Two times a day (BID) | INTRAMUSCULAR | Status: DC
  Administered 2012-03-31 – 2012-04-01 (×2): 3 mL via INTRAVENOUS

## 2012-03-31 MED ORDER — ONDANSETRON HCL 4 MG/2ML IJ SOLN
4.0000 mg | Freq: Three times a day (TID) | INTRAMUSCULAR | Status: AC | PRN
  Administered 2012-03-31: 4 mg via INTRAVENOUS
  Filled 2012-03-31: qty 2

## 2012-03-31 MED ORDER — LORAZEPAM 2 MG/ML IJ SOLN
INTRAMUSCULAR | Status: AC
  Administered 2012-03-31: 2 mg via INTRAVENOUS
  Filled 2012-03-31: qty 1

## 2012-03-31 MED ORDER — LORAZEPAM 2 MG/ML IJ SOLN: 1.0000 mg | INTRAMUSCULAR | Status: DC | PRN

## 2012-03-31 MED ORDER — SODIUM CHLORIDE 0.9 % IV SOLN
1000.0000 mg | Freq: Once | INTRAVENOUS | Status: AC
  Administered 2012-03-31: 1000 mg via INTRAVENOUS
  Filled 2012-03-31 (×2): qty 10

## 2012-03-31 MED ORDER — MAGNESIUM SULFATE 40 MG/ML IJ SOLN
2.0000 g | INTRAMUSCULAR | Status: AC
  Administered 2012-03-31: 2 g via INTRAVENOUS
  Filled 2012-03-31: qty 50

## 2012-03-31 MED ORDER — PROMETHAZINE HCL 12.5 MG PO TABS
12.5000 mg | ORAL_TABLET | Freq: Four times a day (QID) | ORAL | Status: DC | PRN
  Administered 2012-04-01 (×2): 12.5 mg via ORAL
  Filled 2012-03-31 (×2): qty 1

## 2012-03-31 MED ORDER — INFLUENZA VIRUS VACC SPLIT PF IM SUSP
0.5000 mL | INTRAMUSCULAR | Status: AC
  Administered 2012-04-02: 0.5 mL via INTRAMUSCULAR
  Filled 2012-03-31: qty 0.5

## 2012-03-31 MED ORDER — POTASSIUM CHLORIDE 10 MEQ/100ML IV SOLN
10.0000 meq | INTRAVENOUS | Status: AC
  Administered 2012-03-31 – 2012-04-01 (×6): 10 meq via INTRAVENOUS
  Filled 2012-03-31 (×6): qty 100

## 2012-03-31 MED ORDER — LORAZEPAM 2 MG/ML IJ SOLN
1.0000 mg | Freq: Once | INTRAMUSCULAR | Status: AC
  Administered 2012-03-31: 1 mg via INTRAVENOUS
  Filled 2012-03-31: qty 1

## 2012-03-31 MED ORDER — HEPARIN SODIUM (PORCINE) 5000 UNIT/ML IJ SOLN
5000.0000 [IU] | Freq: Three times a day (TID) | INTRAMUSCULAR | Status: DC
  Administered 2012-04-01 – 2012-04-03 (×8): 5000 [IU] via SUBCUTANEOUS
  Filled 2012-03-31 (×15): qty 1

## 2012-03-31 MED ORDER — DEXTROSE 5 % IV SOLN
500.0000 mg | Freq: Three times a day (TID) | INTRAVENOUS | Status: DC
  Administered 2012-03-31 – 2012-04-04 (×11): 500 mg via INTRAVENOUS
  Filled 2012-03-31 (×13): qty 10

## 2012-03-31 MED ORDER — LORAZEPAM 2 MG/ML IJ SOLN
2.0000 mg | Freq: Once | INTRAMUSCULAR | Status: AC
  Administered 2012-03-31: 2 mg via INTRAVENOUS

## 2012-03-31 MED ORDER — SODIUM CHLORIDE 0.9 % IV SOLN
INTRAVENOUS | Status: DC
Start: 2012-03-31 — End: 2012-04-04
  Administered 2012-04-02: 1000 mL via INTRAVENOUS
  Administered 2012-04-02 – 2012-04-03 (×2): via INTRAVENOUS

## 2012-03-31 NOTE — Progress Notes (Signed)
Portable EEG completed

## 2012-03-31 NOTE — ED Provider Notes (Signed)
History     CSN: [id number]  Arrival date & time 03/31/12  4098   First MD Initiated Contact with Patient 03/31/12 1002      Chief Complaint  Patient presents with  . Seizures    (Consider location/radiation/quality/duration/timing/severity/associated sxs/prior treatment) HPI Comments: 57 year old female with a history of hypertension who presents with a complaint of seizure. According to the paramedics the patient had a tonic-clonic seizure witnessed by the husband lasting 2 minutes, no history of prior seizures. She was transported by EMS, on their arrival she was alert, GCS of 14 and help ambulate to the truck. She had a second seizure and route and was given 2-1/2 mg of Versed IV. The seizure lasted approximately 1 minute, was tonic-clonic and she is currently postictal period her CBG was 164  The patient is unable to answer my questions as she is severely postictal, level V caveat apply secondary to altered mental status  Patient is a 56 y.o. female presenting with seizures. The history is provided by the spouse and the EMS personnel.  Seizures   Past Medical History  Diagnosis Date  . Hypertension     History reviewed. No pertinent past surgical history.  No family history on file.  History  Substance Use Topics  . Smoking status: Not on file  . Smokeless tobacco: Not on file  . Alcohol Use: Not on file    OB History   Grav Para Term Preterm Abortions TAB SAB Ect Mult Living                  Review of Systems  Unable to perform ROS Neurological: Positive for seizures.    Allergies  Sulfa antibiotics  Home Medications  No current outpatient prescriptions on file.  BP 87/52  Pulse 77  Temp(Src) 99 F (37.2 C) (Rectal)  Resp 14  SpO2 94%  Physical Exam  Nursing note and vitals reviewed. Constitutional: She appears well-developed and well-nourished. No distress.  HENT:  Head: Normocephalic and atraumatic.  Mouth/Throat: Oropharynx is clear and  moist. No oropharyngeal exudate.  Eyes: Conjunctivae and EOM are normal. Pupils are equal, round, and reactive to light. Right eye exhibits no discharge. Left eye exhibits no discharge. No scleral icterus.  Neck: Normal range of motion. Neck supple. No JVD present. No thyromegaly present.  Cardiovascular: Normal rate, regular rhythm, normal heart sounds and intact distal pulses.  Exam reveals no gallop and no friction rub.   No murmur heard. Pulmonary/Chest: Effort normal and breath sounds normal. No respiratory distress. She has no wheezes. She has no rales.  Abdominal: Soft. Bowel sounds are normal. She exhibits no distension and no mass. There is no tenderness.  Musculoskeletal: Normal range of motion. She exhibits no edema and no tenderness.  Lymphadenopathy:    She has no cervical adenopathy.  Neurological:  The patient responds to painful stimuli by focalizing pain, she does not open her eyes or talk, her GCS is low, she is breathing spontaneously,  Skin: Skin is warm and dry. No rash noted. No erythema.  Psychiatric: She has a normal mood and affect. Her behavior is normal.    ED Course  Procedures (including critical care time)  Labs Reviewed  CBC - Abnormal; Notable for the following:    WBC 14.3 (*)    RBC 5.14 (*)    Hemoglobin 15.8 (*)    All other components within normal limits  DIFFERENTIAL - Abnormal; Notable for the following:    Neutrophils Relative 85 (*)  Neutro Abs 12.2 (*)    Lymphocytes Relative 8 (*)    All other components within normal limits  COMPREHENSIVE METABOLIC PANEL - Abnormal; Notable for the following:    Sodium 130 (*)    Potassium 2.6 (*)    Chloride 88 (*)    CO2 16 (*)    Glucose, Bld 137 (*)    All other components within normal limits  PROTIME-INR  APTT  TROPONIN I  TROPONIN I  POCT I-STAT TROPONIN I   Ct Head Wo Contrast  03/31/2012  *RADIOLOGY REPORT*  Clinical Data: Seizure-like activity.  Postictal presentation.  CT HEAD  WITHOUT CONTRAST  Technique:  Contiguous axial images were obtained from the base of the skull through the vertex without contrast.  Comparison: MRI 03/30/2004.  CT head 09/26/2003.  Findings: No mass lesion, mass effect, midline shift, hydrocephalus, hemorrhage.  No territorial ischemia or acute infarction.  Mastoid air cells are clear.  The paranasal sinuses appear clear.  Hypoplastic frontal sinuses incidentally noted.  IMPRESSION: Negative CT head.   Original Report Authenticated By: Andreas Newport, M.D.     Status Epilepticus 1. Seizures   2. Hypokalemia   3. Hyponatremia   4. Vomiting and diarrhea       MDM  At this time the patient has decreased level of consciousness secondary to seizure, she has never had a seizure, the husband reports there might have been some gastrointestinal symptoms over the course of the day, at this time she has depressed mental status and will require frequent neurologic checks, cardiac monitoring, EKG and a CT scan of the head.   ED ECG REPORT  I personally interpreted this EKG   Date: 03/31/2012   Rate: 81  Rhythm: normal sinus rhythm  QRS Axis: left  Intervals: normal  ST/T Wave abnormalities: nonspecific ST/T changes  Conduction Disutrbances:none  Narrative Interpretation:   Old EKG Reviewed: Compared with 09/10/2010, no significant changes other than increased rate   Pt required 6 mg of Ativan total since arrival in addition to the versed prior to arrival.  She has just started to improve, I have discussed care with hospitalist who will admit.  CT head negative for abnormality, labs show significant electrolyte abnormalities that have caused ongoing seizure - technically this is STATUS EPILEPTICUS as she had not returned to baseline and had another seizure, now with prolonged post ictal and mediated period, likely brought on by her poor PO intake and excessive vomiting and diarrhea.  The pt did develop hypotension and required mutliple fluid boluses  for hypotension.    D/w hospitalist who will admit.  CRITICAL CARE Performed by: Vida Roller   Total critical care time: 35  Critical care time was exclusive of separately billable procedures and treating other patients.  Critical care was necessary to treat or prevent imminent or life-threatening deterioration.  Critical care was time spent personally by me on the following activities: development of treatment plan with patient and/or surrogate as well as nursing, discussions with consultants, evaluation of patient's response to treatment, examination of patient, obtaining history from patient or surrogate, ordering and performing treatments and interventions, ordering and review of laboratory studies, ordering and review of radiographic studies, pulse oximetry and re-evaluation of patient's condition.         Vida Roller, MD 03/31/12 1225

## 2012-03-31 NOTE — H&P (Addendum)
[facility] History and Physical  Courtney Miles ZOX:[id number] DOB: 23-Nov-1955 DOA: 03/31/2012  Referring physician: Hyacinth Meeker EDP PCP: Provider Not In System [facility] Specialists: neurology consulted  Chief Complaint: seizures, GTCT   HPI: Courtney Miles is a 57 y.o. female CF who presented to the emergency room at [facility] 03/31/2012. History has to be obtained from the emergency room chart and nursing notes as no family is available at bedside and although I have called him at phone number listed [phone] he is not available. Vision currently was well until about Wednesday when patient had an episode of diarrhea which appears to be in subsequent to eating some bad ice cream. She then proceeded to have numerous episodes of nausea and vomiting since then. Patient was noted this morning to have had an episode of a grand mal seizure which lasted couple of minutes and was transported by EMS after her husband called with a GCS of 14. On route she was given 2.5 mg of Versed IV and she had another tonic-clonic seizure.   patient was somnolent initially and her airway seemed to be compromised however this improved. She became a little bit more combative had CT scan and was given 2 mg of Ativan and sleep is not able to give history   Review of Systems: The patient cannot give a history  Past Medical History  Diagnosis Date  . Hypertension    Admission 09/26/2003 chest pain which is atypical. History of aortic stenosis in the subaortic membrane-this is considered for operation by Dr. Zenaida Niece tread for aortic valve replacement  History reviewed. No pertinent past surgical history. Social History:  has no tobacco, alcohol, and drug history on file.   Allergies  Allergen Reactions  . Sulfa Antibiotics     No family history on file. cannot answer  Prior to Admission medications   Not on File   Physical Exam: Filed Vitals:   03/31/12 1000 03/31/12 1015 03/31/12 1105 03/31/12 1122   BP: 108/73 111/70 93/60 87/52   Pulse: 82 73 76 77  Temp:      TempSrc:      Resp: 30 22  14   SpO2: 96% 97% 94% 94%     General:  Sleepy but arousable  Eyes:3-4 mm pupils. No pallor or icterus. Unable to stay awake to do further neuro testing  ENT: Mouth seems midline with no droop  Neck: Soft  Cardiovascular: S1-S2 with grade 5/6 murmur left lower sternal edge with radiation  Respiratory: Clinically clear  Abdomen: Soft nontender nondistended  Skin: No lower extremity edema  Neurologic: Cannot fully assess however able to grip both hands and awakens enough to be agitated and wishes to move around in bed  Labs on Admission:  Basic Metabolic Panel:  Recent Labs Lab 03/31/12 1005  NA 130*  K 2.6*  CL 88*  CO2 16*  GLUCOSE 137*  BUN 11  CREATININE 0.78  CALCIUM 9.9   Liver Function Tests:  Recent Labs Lab 03/31/12 1005  AST 34  ALT 19  ALKPHOS 73  BILITOT 0.6  PROT 7.9  ALBUMIN 4.2   No results found for this basename: LIPASE, AMYLASE,  in the last 168 hours No results found for this basename: AMMONIA,  in the last 168 hours CBC:  Recent Labs Lab 03/31/12 1005  WBC 14.3*  NEUTROABS 12.2*  HGB 15.8*  HCT 44.1  MCV 85.8  PLT 296   Cardiac Enzymes:  Recent Labs Lab 03/31/12 1006  TROPONINI <0.30  BNP (last 3 results) No results found for this basename: PROBNP,  in the last 8760 hours CBG: No results found for this basename: GLUCAP,  in the last 168 hours  Radiological Exams on Admission: Ct Head Wo Contrast  03/31/2012  *RADIOLOGY REPORT*  Clinical Data: Seizure-like activity.  Postictal presentation.  CT HEAD WITHOUT CONTRAST  Technique:  Contiguous axial images were obtained from the base of the skull through the vertex without contrast.  Comparison: MRI 03/30/2004.  CT head 09/26/2003.  Findings: No mass lesion, mass effect, midline shift, hydrocephalus, hemorrhage.  No territorial ischemia or acute infarction.  Mastoid air cells are  clear.  The paranasal sinuses appear clear.  Hypoplastic frontal sinuses incidentally noted.  IMPRESSION: Negative CT head.   Original Report Authenticated By: Andreas Newport, M.D.     EKG: Independently reviewed. EKG performed shows sinus rhythm PR interval 0.04 QRS axis is -50 [fascicular block. There does appear to be in the 2 leads V4 V5 and V6 and ST segment depression which is contiguous. Persistent nausea and prior EKG 09/10/2010.  Assessment/Plan Principal Problem:   Seizure Active Problems:   Abnormal EKG   Aortic anomaly   HTN (hypertension)   Hyponatremia   Viral illness   1. Seizure-potentially related to hyponatremia. Patient has had a CT scan of the head which shows no acute abnormality. I would defer MRI and EEG at this point to neurology has been consulted by emergency room. Patient has been given Ativan 2 mg iv x 1, and at this point I will load her with Keppra 1000 mg IV to prevent further seizures, given she is somewhat post ictal and presented with 2 seizures. She may not potentially need further antiepileptic medications.  toxicology screen is relatively low yield and we've have a possible reason so would not order at present 2. Viral illness-potentially related to that ice cream the patient ate. We'll monitor-will place on Phenergan well 0.5 every 6 when necessary 3. Hyponatremia-will replace fluids with saline at 100 cc an hour.  Rpt bmet in am 4. Aortic membrane?-This and workup in the past by Dr. Mayford Knife. This we need to follow in the outpatient setting 5. Hypertension-meds have not been reconciled as yet.  Patient to be Restart home meds once these are back-she was actually hypotensive earlier possibly because of multiple medications and will need to be monitored on telemetry 6. Abnormal EKG-we will get a troponin given her some ST-T wave depressions in the precordial leads. 7. Hypokalemia-likely 2/2 to diarr, N/V-will replace with IV K that has been ordered by EDP.   monitor for now  Discussed personally with neurology who will see patient for recommendations if consultant consulted, please document name and whether formally or informally consulted  Code Status: Presumed full  Family Communication: None at bedside  Disposition Plan: Inpatient   Time spent: 50 minutes  Mahala Menghini [facility] Pager [phone]  If 7PM-7AM, please contact night-coverage [email or web address] Password [facility] 03/31/2012, 11:56 AM

## 2012-03-31 NOTE — ED Notes (Signed)
Pt in CT scan, noted to be combative, taking clothes off, fighting staff.  Pt given 2mg  ativan, pt calmed down.  Became cooperative after ativan.

## 2012-03-31 NOTE — Progress Notes (Signed)
Utilization review completed.  

## 2012-03-31 NOTE — Progress Notes (Signed)
ANTIBIOTIC CONSULT NOTE - INITIAL  Pharmacy Consult for Vancomycin, Ceftriaxone, Acyclovir Indication: r/o meningitis  Allergies  Allergen Reactions  . Sulfa Antibiotics     Patient Measurements:   Estimated height: 58" Estimated weight 170 lb (77kg) Ideal Body Weight: ~45 kg   Vital Signs: Temp: 99 F (37.2 C) (02/21 0955) Temp src: Rectal (02/21 0955) BP: 132/76 mmHg (02/21 1242) Pulse Rate: 81 (02/21 1242) Intake/Output from previous day:   Intake/Output from this shift:    Labs:  Recent Labs  03/31/12 1005  WBC 14.3*  HGB 15.8*  PLT 296  CREATININE 0.78   CrCl is unknown because there is no height on file for the current visit. No results found for this basename: VANCOTROUGH, VANCOPEAK, VANCORANDOM, GENTTROUGH, GENTPEAK, GENTRANDOM, TOBRATROUGH, TOBRAPEAK, TOBRARND, AMIKACINPEAK, AMIKACINTROU, AMIKACIN,  in the last 72 hours   Microbiology: No results found for this or any previous visit (from the past 720 hour(s)).  Medical History: Past Medical History  Diagnosis Date  . Hypertension   . Depression   . Arthritis     Medications:  Anti-infectives   None     Assessment: 57 year old female admitted with seizures to begin empiric antimicrobial therapy with Vancomycin, Ceftriaxone, and Acyclovir for r/o meningitis.  Her renal function is normal.  Her BMI is >30 so ideal body weight will be used for Acyclovir dosing.  Goal of Therapy:  Vancomycin trough level 15-20 mcg/ml  Plan:  Vancomycin 1gm IV q12h Ceftriaxone 2gm IV q12h Acyclovir 500mg  IV q8h (~10mg /kg q8h based on ideal body weight) Monitor renal function closely Follow-up CSF studies  Estella Husk, Pharm.D., BCPS Clinical Pharmacist  Phone [phone] Pager [phone] 03/31/2012, 4:55 PM

## 2012-03-31 NOTE — Consult Note (Signed)
Reason for Consult:Seizure Referring Physician: Mahala Menghini, J  CC: Seizure  History is obtained from:Husband  HPI: Courtney Miles is a 57 y.o. female who was in her normal state of health until yesterday when she awoke with nausea/vomiting. She had eaten some ice cream the night before that she thought tasted funny. She had severe n/v/d all day, but was able to get some fluids down. She then this morning was seen to have a 1 minute long tonic seizure per her husband. He does not describe any focality. She then was transported by EMS who saw another generalized seizure en route. She required versed and a total of 6 mg ativan in the ER to stop the activity. She has continued to be confused, but is steadily getting better per her husband. She has significant electrolyte abnormalities that suggest that they could play a role in the seizure.   She also has a history of encephalitis in 1977, and was in the hospital overnight and released soon thereafter. She had a seizure at that time as well.    ROS: unabel to obtain due to confusion.   Past Medical History  Diagnosis Date  . Hypertension     Family History: unabel to obtain due to confusion.   Social History: Tob: unabel to obtain due to confusion.    Exam: Current vital signs: BP 132/76  Pulse 81  Temp(Src) 99 F (37.2 C) (Rectal)  Resp 16  SpO2 98% Vital signs in last 24 hours: Temp:  [99 F (37.2 C)] 99 F (37.2 C) (02/21 0955) Pulse Rate:  [73-82] 81 (02/21 1242) Resp:  [14-30] 16 (02/21 1242) BP: (87-132)/(52-76) 132/76 mmHg (02/21 1242) SpO2:  [94 %-98 %] 98 % (02/21 1242)  General: in bed, appears mildly uncomfortable CV: RRR Mental Status: Patient is awake, alert, states "[place]" to where are you and unable to give year.  She does follow commands immediately, however she has difficulty with even mildly complicated commands.  Cranial Nerves: II: Visual Fields are full to finger movement. Pupils are equal,  round, and reactive to light.  Discs are difficult to visualize. III,IV, VI: EOMI without ptosis or diploplia.  V: Facial sensation is symmetric to LT VII: Facial movement is symmetric.  VIII: hearing is intact to voice X: Uvula elevates symmetrically XI: Shoulder shrug is symmetric. XII: tongue is midline without atrophy or fasciculations.  Motor: Tone is normal. Bulk is normal. 5/5 strength was present in all four extremities.  Sensory: Sensation is symmetric to light touch  Deep Tendon Reflexes: 2+ and symmetric in the biceps and patellae.  Cerebellar: FNF without clear dysmetria Gait: Did not assess due to ams.     I have reviewed labs in epic and the results pertinent to this consultation are: Na - 130  K - 2.6  I have reviewed the images obtained:CT head - negative.   Impression: 57 yo F with GI illness and seizure in the setting of multiple metabolic abnormalities. I suspect her seizure was multifactorial including electrolyte abnoramlity, GI infection, potentially lower seizure threshold given history of seizure. She received significant benzodiazepine dose and had prolonged seizure activity so I am not surprised that she is still post-ictal, but would favor ruling out ongoing activity with EEG.   Recommendations: 1) Agree with keppra, will start 500mg  BID, though anticipate this will not be needed long term.  2) Stat EEG given continued confusion.  3) MRI brain 4) Will check Mg, Phos   Ritta Slot, MD [facility] [phone]  If 7pm- 7am, please page neurology on call at [phone].

## 2012-03-31 NOTE — ED Notes (Signed)
Husband: [name];  [phone]

## 2012-03-31 NOTE — Procedures (Signed)
History: 57 yo F with seizure and continued confusion.   Sedation: Ativan prior to procedure  Background: There is a well defined posterior dominant rhythm of 9.5 Hz that attenuates with eye opening. There is promiannt movement artifact throughout the recording and at times various leads are not well visualized due to artifact. The periods that are free of artifact, however, demonstrate excessive beta activity in addition to the normal underlyign rhythms.   Photic stimulation: Physiologic driving is not performed  EEG Diagnosis: 1) Normal EEG  Clinical Interpretation: This normal EEG is recorded in the waking state. The excessve beta activity is liekly medication related. There was no seizure or seizure predisposition recorded on this study.   Ritta Slot, MD [facility] [phone]  If 7pm- 7am, please page neurology on call at [phone].

## 2012-03-31 NOTE — ED Notes (Signed)
Per [facility], pt had witnessed seizure by husband lasting about 2 minutes with NO history. Pt was alert on EMS arrival and ambulatory to truck but somewhat still postictal. Second seizure in route and gave 2 1/2 versed IV to #20 LH and lasted about 1 minute. O2 at 2L/[place]. CBG 164.

## 2012-04-01 ENCOUNTER — Inpatient Hospital Stay (HOSPITAL_COMMUNITY): Payer: BC Managed Care – PPO

## 2012-04-01 DIAGNOSIS — B9789 Other viral agents as the cause of diseases classified elsewhere: Secondary | ICD-10-CM

## 2012-04-01 DIAGNOSIS — E871 Hypo-osmolality and hyponatremia: Secondary | ICD-10-CM

## 2012-04-01 DIAGNOSIS — R197 Diarrhea, unspecified: Secondary | ICD-10-CM

## 2012-04-01 DIAGNOSIS — R1110 Vomiting, unspecified: Secondary | ICD-10-CM

## 2012-04-01 LAB — COMPREHENSIVE METABOLIC PANEL
AST: 41 U/L — ABNORMAL HIGH (ref 0–37)
BUN: 6 mg/dL (ref 6–23)
CO2: 21 mEq/L (ref 19–32)
Chloride: 98 mEq/L (ref 96–112)
Creatinine, Ser: 0.64 mg/dL (ref 0.50–1.10)
GFR calc Af Amer: >90 mL/min (ref >90)
GFR calc non Af Amer: >90 mL/min (ref >90)
Glucose, Bld: 107 mg/dL — ABNORMAL HIGH (ref 70–99)
Total Bilirubin: 0.8 mg/dL (ref 0.3–1.2)

## 2012-04-01 LAB — CSF CELL COUNT WITH DIFFERENTIAL
RBC Count, CSF: 150 /mm3 — ABNORMAL HIGH
WBC, CSF: 1 /mm3 (ref 0–5)

## 2012-04-01 LAB — CBC
HCT: 41.6 % (ref 36.0–46.0)
MCHC: 35.3 g/dL (ref 30.0–36.0)
RDW: 13.6 % (ref 11.5–15.5)

## 2012-04-01 LAB — PROTIME-INR: INR: 1.07 (ref 0.00–1.49)

## 2012-04-01 MED ORDER — MORPHINE SULFATE 2 MG/ML IJ SOLN
1.0000 mg | INTRAMUSCULAR | Status: DC | PRN
  Administered 2012-04-01 – 2012-04-02 (×6): 1 mg via INTRAVENOUS
  Filled 2012-04-01 (×7): qty 1

## 2012-04-01 MED ORDER — PROMETHAZINE HCL 12.5 MG PO TABS
12.5000 mg | ORAL_TABLET | ORAL | Status: DC | PRN
  Administered 2012-04-01 – 2012-04-02 (×3): 12.5 mg via ORAL
  Filled 2012-04-01 (×3): qty 1

## 2012-04-01 MED ORDER — SODIUM CHLORIDE 0.9 % IV SOLN
500.0000 mg | Freq: Two times a day (BID) | INTRAVENOUS | Status: DC
  Administered 2012-04-01: 500 mg via INTRAVENOUS
  Filled 2012-04-01 (×2): qty 5

## 2012-04-01 MED ORDER — POTASSIUM CHLORIDE 10 MEQ/100ML IV SOLN
10.0000 meq | INTRAVENOUS | Status: AC
  Administered 2012-04-01: 10 meq via INTRAVENOUS
  Filled 2012-04-01: qty 100

## 2012-04-01 MED ORDER — SODIUM CHLORIDE 0.9 % IV SOLN
500.0000 mg | Freq: Two times a day (BID) | INTRAVENOUS | Status: DC
  Filled 2012-04-01: qty 5

## 2012-04-01 MED ORDER — ONDANSETRON HCL 4 MG/2ML IJ SOLN
4.0000 mg | Freq: Four times a day (QID) | INTRAMUSCULAR | Status: DC | PRN
  Administered 2012-04-01 – 2012-04-02 (×3): 4 mg via INTRAVENOUS
  Filled 2012-04-01 (×3): qty 2

## 2012-04-01 MED ORDER — ACETAMINOPHEN 325 MG PO TABS: 650.0000 mg | ORAL_TABLET | ORAL | Status: DC | PRN

## 2012-04-01 MED ORDER — PANTOPRAZOLE SODIUM 40 MG PO TBEC
40.0000 mg | DELAYED_RELEASE_TABLET | Freq: Every day | ORAL | Status: DC
  Administered 2012-04-02 – 2012-04-04 (×3): 40 mg via ORAL
  Filled 2012-04-01 (×4): qty 1

## 2012-04-01 NOTE — Progress Notes (Addendum)
Subjective: Continues to improve, husband does not feel completely at baseline, but near it.   Exam: Filed Vitals:   04/01/12 0550  BP: 144/74  Pulse: 81  Temp: 98.9 F (37.2 C)  Resp:    Gen: In bed, NAD MS: Awake, Alert, oriented to month and year, but takes her some thought to answer.  ZO:XWRUE, EOMI, face symmetric Motor: 5/5 throughout Sensory:intact to LT.   Impression: 57 yo F with seizure in the setting of illness and electrolyte abnormalities. Her prolonged confusion after the event prompted an emergent EEG which did not demonstrate status epilepticus. Due to the lack of return to baseline, she was started on empiric CNS coverage pending an LP.   Recommendations: 1) F/U LP/MRI results.  2) If non-infectious CSF, then could discontinue anti-microbials.  3) Continue keppra for now pending studies, if all studies are normal, then would plan on calling this a provoked seizure and not plan to continue AEDs in the long term.   Ritta Slot, MD [facility] [phone]  If 7pm- 7am, please page neurology on call at [phone].    LP shows 1 wbc, concern for bacterial process minimal and will discontinue anti-bacterials. 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 Would also continue keppra while on acyclovir.   Ritta Slot, MD [facility] [phone]  If 7pm- 7am, please page neurology on call at [phone].

## 2012-04-01 NOTE — Procedures (Signed)
Procedure:  Lumbar puncture under fluoroscopy Findings:  LP at L3-4 utilizing 20 G needle from right translaminar approach.  Clear CSF with est opening pressure of 10 cm water.  13 mL of fluid collected in 4 vials for lab analysis.

## 2012-04-01 NOTE — Progress Notes (Signed)
[facility] PROGRESS NOTE  AFTIN LYE ZOX:[id number] DOB: 1955/11/30 DOA: 03/31/2012 PCP: Provider Not In System  Assessment/Plan: 1. Seizure:  - no seizure activity since admission.  - EEG  - normal. - MRI brain ordered  And showed no visible abnormality. - keppra 500mg  BID ordered yesterday, discontinued by  Neuro today.  -   2. Nausea/ Vomiting: probably viral gastroenteritis.  Improving. Antiemetics. IV fluids. And will start her on clear liquid diet.   3. Hyponatremia: improving. On IV normal saline for hydration.   4. Encephalopathy: of unclear etiology probably metabolic in view of the hyponatremia. Improving. She is also being treated empirically for viral meningitis with vancomycin, rocepin and IV acyclovir. LP was done this am and showed only 1 wbc , we are going to stop vancomycin and rocephin and we will continue with IV acyclovir till we get HSV results. She is currently NPO. She passed bed side RN swallow evaluation.     5. Hypertension: suboptimal . pemissive hypertension.   6. Hypokalemia: improving and being repleted as needed. Magnesium level within normal limits.   DVT prophylaxis   Code Status: full code Family Communication: none at bedside Disposition Plan: pending.   Consultants:  Neurology.   HPI/Subjective:  nauseated.   Objective: Filed Vitals:   03/31/12 1122 03/31/12 1242 03/31/12 2210 04/01/12 0550  BP: 87/52 132/76 151/82 144/74  Pulse: 77 81 74 81  Temp:   98.4 F (36.9 C) 98.9 F (37.2 C)  TempSrc:   Axillary Oral  Resp: 14 16    Weight:    69.4 kg (153 lb)  SpO2: 94% 98% 96% 100%    Intake/Output Summary (Last 24 hours) at 04/01/12 0923 Last data filed at 04/01/12 0600  Gross per 24 hour  Intake      0 ml  Output   1800 ml  Net  -1800 ml   Filed Weights   04/01/12 0550  Weight: 69.4 kg (153 lb)    Exam: SLEEPING briefly arousable,  afebrile comfortable Cardiovascular: S1-S2 RRR.  Respiratory: Clinically  clear  Abdomen: Soft nontender nondistended  Skin: No lower extremity edema  Neurologic: Cannot fully assess however able to grip both hands and awakens enough to be agitated and wishes to move around in bed   Data Reviewed: Basic Metabolic Panel:  Recent Labs Lab 03/31/12 1005 04/01/12 0600  NA 130* 133*  K 2.6* 3.3*  CL 88* 98  CO2 16* 21  GLUCOSE 137* 107*  BUN 11 6  CREATININE 0.78  0.78 0.64  CALCIUM 9.9 9.1  MG 1.7  --   PHOS 4.6  --    Liver Function Tests:  Recent Labs Lab 03/31/12 1005 04/01/12 0600  AST 34 41*  ALT 19 20  ALKPHOS 73 61  BILITOT 0.6 0.8  PROT 7.9 7.0  ALBUMIN 4.2 3.7   No results found for this basename: LIPASE, AMYLASE,  in the last 168 hours  Recent Labs Lab 03/31/12 1710  AMMONIA 45   CBC:  Recent Labs Lab 03/31/12 1005 04/01/12 0600  WBC 14.3* 14.3*  NEUTROABS 12.2*  --   HGB 15.8* 14.7  HCT 44.1 41.6  MCV 85.8 84.9  PLT 296 270   Cardiac Enzymes:  Recent Labs Lab 03/31/12 1006 03/31/12 1224  TROPONINI <0.30 <0.30   BNP (last 3 results) No results found for this basename: PROBNP,  in the last 8760 hours CBG: No results found for this basename: GLUCAP,  in the last 168 hours  No results found for this or any previous visit (from the past 240 hour(s)).   Studies: Ct Head Wo Contrast  03/31/2012  *RADIOLOGY REPORT*  Clinical Data: Seizure-like activity.  Postictal presentation.  CT HEAD WITHOUT CONTRAST  Technique:  Contiguous axial images were obtained from the base of the skull through the vertex without contrast.  Comparison: MRI 03/30/2004.  CT head 09/26/2003.  Findings: No mass lesion, mass effect, midline shift, hydrocephalus, hemorrhage.  No territorial ischemia or acute infarction.  Mastoid air cells are clear.  The paranasal sinuses appear clear.  Hypoplastic frontal sinuses incidentally noted.  IMPRESSION: Negative CT head.   Original Report Authenticated By: Andreas Newport, M.D.     Scheduled Meds: .  acyclovir  500 mg Intravenous Q8H  . cefTRIAXone (ROCEPHIN)  IV  2 g Intravenous Q12H  . heparin  5,000 Units Subcutaneous Q8H  . influenza  inactive virus vaccine  0.5 mL Intramuscular Tomorrow-1000  . levETIRAcetam  1,000 mg Intravenous Q12H  . potassium chloride  10 mEq Intravenous Q1 Hr x 2  . sodium chloride  3 mL Intravenous Q12H  . vancomycin  1,000 mg Intravenous Q12H   Continuous Infusions: . sodium chloride 100 mL/hr at 03/31/12 1420    Principal Problem:   Seizure Active Problems:   Abnormal EKG   Aortic anomaly   HTN (hypertension)   Hyponatremia   Viral illness        Bane Hagy  [facility] Pager [phone] If 8PM-8AM, please contact night-coverage at [email or web address], password [facility] 04/01/2012, 9:23 AM  LOS: 1 day

## 2012-04-02 ENCOUNTER — Inpatient Hospital Stay (HOSPITAL_COMMUNITY): Payer: BC Managed Care – PPO

## 2012-04-02 DIAGNOSIS — R4182 Altered mental status, unspecified: Secondary | ICD-10-CM

## 2012-04-02 DIAGNOSIS — E876 Hypokalemia: Secondary | ICD-10-CM

## 2012-04-02 DIAGNOSIS — I10 Essential (primary) hypertension: Secondary | ICD-10-CM

## 2012-04-02 LAB — CBC
HCT: 40.9 % (ref 36.0–46.0)
MCH: 29.2 pg (ref 26.0–34.0)
MCV: 86.5 fL (ref 78.0–100.0)
RBC: 4.73 MIL/uL (ref 3.87–5.11)
RDW: 13.6 % (ref 11.5–15.5)
WBC: 8 10*3/uL (ref 4.0–10.5)

## 2012-04-02 LAB — BASIC METABOLIC PANEL
BUN: 5 mg/dL — ABNORMAL LOW (ref 6–23)
CO2: 25 mEq/L (ref 19–32)
Chloride: 99 mEq/L (ref 96–112)
Creatinine, Ser: 0.64 mg/dL (ref 0.50–1.10)

## 2012-04-02 LAB — HEPATIC FUNCTION PANEL
ALT: 22 U/L (ref 0–35)
AST: 52 U/L — ABNORMAL HIGH (ref 0–37)
Albumin: 3.3 g/dL — ABNORMAL LOW (ref 3.5–5.2)
Alkaline Phosphatase: 56 U/L (ref 39–117)
Total Bilirubin: 0.5 mg/dL (ref 0.3–1.2)

## 2012-04-02 MED ORDER — POTASSIUM CHLORIDE 10 MEQ/100ML IV SOLN
10.0000 meq | INTRAVENOUS | Status: AC
  Administered 2012-04-02 (×5): 10 meq via INTRAVENOUS
  Filled 2012-04-02 (×7): qty 100

## 2012-04-02 MED ORDER — METOPROLOL SUCCINATE ER 50 MG PO TB24
50.0000 mg | ORAL_TABLET | Freq: Every day | ORAL | Status: DC
Start: 2012-04-02 — End: 2012-04-04
  Administered 2012-04-02 – 2012-04-03 (×2): 50 mg via ORAL
  Filled 2012-04-02 (×3): qty 1

## 2012-04-02 MED ORDER — BUDESONIDE-FORMOTEROL FUMARATE 80-4.5 MCG/ACT IN AERO
2.0000 | INHALATION_SPRAY | Freq: Two times a day (BID) | RESPIRATORY_TRACT | Status: DC
  Administered 2012-04-02 – 2012-04-04 (×3): 2 via RESPIRATORY_TRACT
  Filled 2012-04-02: qty 6.9

## 2012-04-02 MED ORDER — DULOXETINE HCL 60 MG PO CPEP
60.0000 mg | ORAL_CAPSULE | Freq: Every day | ORAL | Status: DC
  Administered 2012-04-02 – 2012-04-04 (×3): 60 mg via ORAL
  Filled 2012-04-02 (×3): qty 1

## 2012-04-02 MED ORDER — IOHEXOL 300 MG/ML  SOLN
25.0000 mL | INTRAMUSCULAR | Status: AC
  Administered 2012-04-02 (×2): 25 mL via ORAL

## 2012-04-02 MED ORDER — POTASSIUM CHLORIDE 10 MEQ/100ML IV SOLN
10.0000 meq | INTRAVENOUS | Status: AC
  Administered 2012-04-02 (×2): 10 meq via INTRAVENOUS
  Filled 2012-04-02 (×2): qty 100

## 2012-04-02 MED ORDER — BUDESONIDE-FORMOTEROL FUMARATE 80-4.5 MCG/ACT IN AERO
2.0000 | INHALATION_SPRAY | Freq: Two times a day (BID) | RESPIRATORY_TRACT | Status: DC
  Filled 2012-04-02: qty 6.9

## 2012-04-02 MED ORDER — IRBESARTAN 75 MG PO TABS
75.0000 mg | ORAL_TABLET | Freq: Every day | ORAL | Status: DC
  Administered 2012-04-02 – 2012-04-03 (×2): 75 mg via ORAL
  Filled 2012-04-02 (×3): qty 1

## 2012-04-02 MED ORDER — LEVETIRACETAM 500 MG PO TABS
500.0000 mg | ORAL_TABLET | Freq: Two times a day (BID) | ORAL | Status: DC
  Administered 2012-04-02 – 2012-04-03 (×4): 500 mg via ORAL
  Filled 2012-04-02 (×6): qty 1

## 2012-04-02 MED ORDER — ALBUTEROL SULFATE HFA 108 (90 BASE) MCG/ACT IN AERS
2.0000 | INHALATION_SPRAY | Freq: Four times a day (QID) | RESPIRATORY_TRACT | Status: DC | PRN
  Filled 2012-04-02: qty 6.7

## 2012-04-02 MED ORDER — IOHEXOL 300 MG/ML  SOLN
100.0000 mL | Freq: Once | INTRAMUSCULAR | Status: AC | PRN
  Administered 2012-04-02: 100 mL via INTRAVENOUS

## 2012-04-02 NOTE — Progress Notes (Signed)
MD notified of critical lab result K+ 2.5 new orders noted

## 2012-04-02 NOTE — Progress Notes (Signed)
Subjective: Continues to have mild confusion, though again improved today.   Exam: Filed Vitals:   04/02/12 0400  BP: 156/89  Pulse: 74  Temp: 98.3 F (36.8 C)  Resp: 16   Gen: In bed, NAD MS: Awake, Alert, oriented. Able to give # of quarters in $2.75 and spell world backwards.  GN:FAOZH, EOMI Motor: 5/5 throughout.  Sensory:intact to LT.  Impression:  57 yo F with gastrointestinal distress(n/v/d) for a day followed by seizure then persistent but improving confusion. Given the prolonged period of confusion following her seizure, other testing was performed including LP. Though there was no leukocytosis, given her persistent confusion, I would favor continuing acyclovir until the HSV by PCR is back.   Recommendations: 1)Continue keppra and acyclovir pending HSV by PCR results.  2) If PCR is negative, would consider this a provoked seizure and can stop keppra as well as acyclovir.   Ritta Slot, MD [facility] [phone]  If 7pm- 7am, please page neurology on call at [phone].

## 2012-04-02 NOTE — Progress Notes (Signed)
[facility] PROGRESS NOTE  Courtney Miles RUE:[id number] DOB: 1955/09/11 DOA: 03/31/2012 PCP: Provider Not In System  Assessment/Plan: 1. Seizure:  - no seizure activity since admission.  - EEG  - normal. - MRI brain ordered  And showed no visible abnormality. - keppra 500mg  BID ordered.  -   2. Nausea/ Vomiting: probably viral gastroenteritis.  Improving. Antiemetics. IV fluids. And started her on clear liquid diet.  Will advance diet as tolerated.   3. Hyponatremia: resolved.   4. Encephalopathy: of unclear etiology probably metabolic in view of the hyponatremia. Improving. She is also being treated empirically for viral meningitis with vancomycin, rocepin and IV acyclovir. LP was done this am and showed only 1 wbc , we are going to stop vancomycin and rocephin and we will continue with IV acyclovir till we get HSV results. She is currently NPO. She passed bed side RN swallow evaluation.   Started her on diet.   5. Hypertension: suboptimal . pemissive hypertension.   6. Hypokalemia:  being repleted as needed. Magnesium level within normal limits.   7. Abdominal pain: unclear etiology. Liver function tests and lipase ordered. CT abd and pelvis is pending. Lactic acid within normal limits.   DVT prophylaxis   Code Status: full code Family Communication: none at bedside Disposition Plan: pending.   Consultants:  Neurology.   HPI/Subjective:  nauseated.   Objective: Filed Vitals:   04/01/12 1630 04/01/12 2100 04/01/12 2354 04/02/12 0400  BP: 146/85 160/93 105/68 156/89  Pulse: 78 80 75 74  Temp: 99.4 F (37.4 C) 100 F (37.8 C) 99.3 F (37.4 C) 98.3 F (36.8 C)  TempSrc: Oral     Resp: 20 16 16 16   Weight:      SpO2: 99% 96% 95% 95%    Intake/Output Summary (Last 24 hours) at 04/02/12 1234 Last data filed at 04/02/12 0700  Gross per 24 hour  Intake   2283 ml  Output   2225 ml  Net     58 ml   Filed Weights   04/01/12 0550  Weight: 69.4 kg (153  lb)    Exam: SLEEPING briefly arousable,  afebrile comfortable Cardiovascular: S1-S2 RRR.  Respiratory: Clinically clear  Abdomen: Soft nontender nondistended BS+ Skin: No lower extremity edema  Neurologic: Cannot fully assess however able to grip both hands and awakens enough to be agitated and wishes to move around in bed   Data Reviewed: Basic Metabolic Panel:  Recent Labs Lab 03/31/12 1005 04/01/12 0600 04/02/12 0915 04/02/12 1045  NA 130* 133* 135  --   K 2.6* 3.3* 2.5*  --   CL 88* 98 99  --   CO2 16* 21 25  --   GLUCOSE 137* 107* 104*  --   BUN 11 6 5*  --   CREATININE 0.78  0.78 0.64 0.64  --   CALCIUM 9.9 9.1 8.9  --   MG 1.7  --   --  1.9  PHOS 4.6  --   --   --    Liver Function Tests:  Recent Labs Lab 03/31/12 1005 04/01/12 0600  AST 34 41*  ALT 19 20  ALKPHOS 73 61  BILITOT 0.6 0.8  PROT 7.9 7.0  ALBUMIN 4.2 3.7   No results found for this basename: LIPASE, AMYLASE,  in the last 168 hours  Recent Labs Lab 03/31/12 1710  AMMONIA 45   CBC:  Recent Labs Lab 03/31/12 1005 04/01/12 0600 04/02/12 0915  WBC 14.3* 14.3* 8.0  NEUTROABS 12.2*  --   --   HGB 15.8* 14.7 13.8  HCT 44.1 41.6 40.9  MCV 85.8 84.9 86.5  PLT 296 270 244   Cardiac Enzymes:  Recent Labs Lab 03/31/12 1006 03/31/12 1224  TROPONINI <0.30 <0.30   BNP (last 3 results) No results found for this basename: PROBNP,  in the last 8760 hours CBG: No results found for this basename: GLUCAP,  in the last 168 hours  No results found for this or any previous visit (from the past 240 hour(s)).   Studies: Mr Sherrin Daisy Contrast  04/01/2012  *RADIOLOGY REPORT*  Clinical Data: Seizure.  MRI HEAD WITHOUT CONTRAST  Technique:  Multiplanar, multiecho pulse sequences of the brain and surrounding structures were obtained according to standard protocol without intravenous contrast.  Comparison: Head CT 03/31/2012.  MRI 04/07/2004.  Findings: The exam suffers from some motion  degradation.  Diffusion imaging does not show any acute or subacute infarction.  Routine pulse sequences show a normal appearance of the brain.  No evidence of old infarction, mass lesion, hemorrhage, hydrocephalus or extra- axial collection.  No mesial temporal abnormality.  No pituitary mass.  No inflammatory sinus disease.  No skull or skull base lesion.  IMPRESSION: Motion degraded examination.  No visible abnormality.   Original Report Authenticated By: Paulina Fusi, M.D.    Dg Abd 2 Views  04/01/2012  *RADIOLOGY REPORT*  Clinical Data: Nausea and vomiting since Thursday.  Abdominal pain.  ABDOMEN - 2 VIEW  Comparison: 04/14/2009  Findings: Gas and stool throughout the colon.  No small or large bowel distension.  No free intra-abdominal air.  No abnormal air fluid levels.  Tiny calcification in the left abdomen may represent a small left renal stone.  This is stable in location and size. Calcified phleboliths in the pelvis.  Visualized bones appear intact.  No significant change since previous study.  IMPRESSION: Nonobstructive bowel gas pattern.  Calcification over the left kidney is stable.   Original Report Authenticated By: Burman Nieves, M.D.    Dg Fluoro Guide Lumbar Puncture  04/01/2012  *RADIOLOGY REPORT*  Clinical Data:  Altered mental status.  DIAGNOSTIC LUMBAR PUNCTURE UNDER FLUOROSCOPIC GUIDANCE  Fluoroscopy time:  56 seconds.  Technique:  Informed consent was obtained from the patient prior to the procedure, including potential complications of headache, allergy, and pain.   With the patient prone, the lower back was prepped with Betadine.  1% Lidocaine was used for local anesthesia.  Lumbar puncture was performed at the L3-4 level using a 20 gauge needle with return of clear CSF with an opening pressure of 10 cm water.   13 ml of CSF were obtained for laboratory studies.  The patient tolerated the procedure well and there were no apparent complications.  IMPRESSION: Lumbar puncture  performed under fluoroscopy.   Original Report Authenticated By: Irish Lack, M.D.     Scheduled Meds: . acyclovir  500 mg Intravenous Q8H  . heparin  5,000 Units Subcutaneous Q8H  . iohexol  25 mL Oral Q1 Hr x 2  . levETIRAcetam  500 mg Oral BID  . pantoprazole  40 mg Oral Q0600  . potassium chloride  10 mEq Intravenous Q1 Hr x 6  . sodium chloride  3 mL Intravenous Q12H   Continuous Infusions: . sodium chloride 1,000 mL (04/02/12 0955)    Principal Problem:   Seizure Active Problems:   Abnormal EKG   Aortic anomaly   HTN (hypertension)   Hyponatremia   Viral illness  Altered mental status        Aleksander Edmiston  [facility] Pager [phone] If 8PM-8AM, please contact night-coverage at [email or web address], password [facility] 04/02/2012, 12:34 PM  LOS: 2 days

## 2012-04-03 LAB — GI PATHOGEN PANEL BY PCR, STOOL
E coli (ETEC) LT/ST: NEGATIVE
E coli (STEC): NEGATIVE
Norovirus GI/GII: NEGATIVE
Salmonella by PCR: NEGATIVE
Shigella by PCR: NEGATIVE

## 2012-04-03 LAB — HERPES SIMPLEX VIRUS(HSV) DNA BY PCR
HSV 1 DNA: NOT DETECTED
HSV 2 DNA: NOT DETECTED

## 2012-04-03 LAB — BASIC METABOLIC PANEL
CO2: 23 mEq/L (ref 19–32)
Calcium: 8.8 mg/dL (ref 8.4–10.5)
Glucose, Bld: 86 mg/dL (ref 70–99)
Sodium: 138 mEq/L (ref 135–145)

## 2012-04-03 LAB — CBC
HCT: 40.5 % (ref 36.0–46.0)
MCHC: 34.1 g/dL (ref 30.0–36.0)
MCV: 87.3 fL (ref 78.0–100.0)
RDW: 13.8 % (ref 11.5–15.5)
WBC: 8 10*3/uL (ref 4.0–10.5)

## 2012-04-03 MED ORDER — POTASSIUM CHLORIDE CRYS ER 20 MEQ PO TBCR
40.0000 meq | EXTENDED_RELEASE_TABLET | Freq: Two times a day (BID) | ORAL | Status: AC
  Administered 2012-04-03 (×2): 40 meq via ORAL
  Filled 2012-04-03: qty 2

## 2012-04-03 NOTE — Evaluation (Signed)
Occupational Therapy Evaluation Patient Details Name: Courtney Miles MRN: [id number] DOB: May 29, 1955 Today's Date: 04/03/2012 Time: 4098-1191 OT Time Calculation (min): 28 min  OT Assessment / Plan / Recommendation Clinical Impression  57 yo female admitted for grand mal seizure, encephalopathy unknown origin and R/o viral gatroenteritis. Ot to sign off acute. no follow up    OT Assessment  Patient does not need any further OT services    Follow Up Recommendations  No OT follow up    Barriers to Discharge      Equipment Recommendations  None recommended by OT    Recommendations for Other Services    Frequency       Precautions / Restrictions Precautions Precautions: None Restrictions Weight Bearing Restrictions: No   Pertinent Vitals/Pain     ADL  Eating/Feeding: Independent Where Assessed - Eating/Feeding: Bed level Grooming: Wash/dry hands;Wash/dry face;Independent Where Assessed - Grooming: Unsupported standing Lower Body Dressing: Independent Where Assessed - Lower Body Dressing: Unsupported sit to stand Toilet Transfer: Independent Toilet Transfer Method: Sit to Barista: Regular height toilet Equipment Used: Gait belt Transfers/Ambulation Related to ADLs: Pt ambulating mod i and does report dizziness as the room moving and patient feeling stable. pt states "its like its moving and I am staying still". Pt reports small amounts of PO intake.  ADL Comments: Pt is able to complete bed mobility, cross bil le and no changes in UE sensation. 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   Prior  [employer] Living Lives With: Spouse Available Help at Discharge: Family;Available PRN/intermittently Type of Home: House Home Access: Stairs to enter Home Layout: Multi-level Alternate Level Stairs-Rails: Can reach both Bathroom Shower/Tub: Tub/shower unit;Curtain Firefighter: Standard Home Adaptive Equipment: Grab bars in shower Additional Comments: Tax adviser Prior Function Level of Independence: Independent Able to Take Stairs?: Yes Driving: Yes Vocation: Retired Musician: No difficulties         Vision/Perception Vision - History Baseline Vision: Wears glasses only for reading Patient Visual Report: No change from baseline   Cognition  Cognition Overall Cognitive Status: Appears within functional limits for tasks assessed/performed Arousal/Alertness: Awake/alert Orientation Level: Appears intact for tasks assessed Behavior During Session: [facility] for tasks performed    Extremity/Trunk Assessment Right Upper Extremity Assessment RUE ROM/Strength/Tone: Within functional levels RUE Sensation: WFL - Light Touch RUE Coordination: WFL - gross/fine motor Left Upper Extremity Assessment LUE ROM/Strength/Tone: Within functional levels LUE Sensation: WFL - Light Touch LUE Coordination: WFL - gross/fine motor Right Lower Extremity Assessment RLE ROM/Strength/Tone: Within functional levels RLE Sensation: WFL - Light Touch RLE Coordination: WFL - gross/fine motor Left Lower Extremity Assessment LLE ROM/Strength/Tone: Within functional levels LLE Sensation: WFL - Light Touch LLE Coordination: WFL - gross/fine motor Trunk Assessment Trunk Assessment: Normal     Mobility Bed Mobility Bed Mobility: Supine to Sit;Sitting - Scoot to Edge of Bed Supine to Sit: 7: Independent Sitting - Scoot to Edge of Bed: 7: Independent Details for Bed Mobility Assistance: safe mobility Transfers Transfers: Sit to Stand;Stand to Sit Sit to  Stand:  7: Independent Stand to Sit: 7: Independent Details for Transfer Assistance: safe mobility     Exercise     Balance Balance Balance Assessed: Yes Static Sitting Balance Static Sitting - Balance Support: No upper extremity supported;Feet unsupported Static Sitting - Level of Assistance: 7: Independent High Level Balance High Level Balance Activites: Backward walking;Direction changes;Turns;Sudden stops;Head turns High Level Balance Comments: safe with mobility   End of Session OT - End of Session Activity Tolerance: Patient tolerated treatment well Patient left: in bed;with call bell/phone within reach Nurse Communication: Mobility status;Precautions  GO     Lucile Shutters 04/03/2012, 2:34 PM Pager: [phone]

## 2012-04-03 NOTE — Progress Notes (Signed)
[facility] PROGRESS NOTE  Courtney Miles ZOX:[id number] DOB: 09-14-1955 DOA: 03/31/2012 PCP: Provider Not In System  Assessment/Plan: 1. Seizure:  - no seizure activity since admission.  - EEG  - normal. - MRI brain ordered  And showed no visible abnormality. - keppra 500mg  BID ordered.  -   2. Nausea/ Vomiting: probably viral gastroenteritis. Almost resolved. Antiemetics. IV fluids. And started her on clear liquid diet.  Will advance diet as tolerated. abd films and ct abd pelvis did not show any significant pathology.  3. Hyponatremia: resolved.   4. Encephalopathy: of unclear etiology probably metabolic in view of the hyponatremia.  She is also being treated empirically for viral meningitis with vancomycin, rocepin and IV acyclovir. LP was done on 2/22 and CSF analysis showed only 1 wbc , we are going to stop vancomycin and rocephin and we will continue with IV acyclovir till we get HSV results. She is currently NPO. She passed bed side RN swallow evaluation.   Started her on diet.   5. Hypertension: suboptimal . pemissive hypertension.   6. Hypokalemia:  being repleted as needed. Magnesium level within normal limits.   7. Abdominal pain: unclear etiology. Resolved.  Liver function tests and lipase ordered and they were normal. Lactic acid within normal limits.   DVT prophylaxis   Code Status: full code Family Communication: none at bedside Disposition Plan: pending.   Consultants:  Neurology.   HPI/Subjective:  In good spirits.   Objective: Filed Vitals:   04/02/12 1305 04/02/12 2100 04/03/12 0500 04/03/12 0749  BP: 126/71 120/85 134/83   Pulse: 68 84 69   Temp: 98.6 F (37 C) 98.8 F (37.1 C) 97.9 F (36.6 C)   TempSrc: Oral     Resp: 18 18 18    Weight:      SpO2: 100% 96% 97% 98%    Intake/Output Summary (Last 24 hours) at 04/03/12 1511 Last data filed at 04/03/12 1014  Gross per 24 hour  Intake   3500 ml  Output   2300 ml  Net   1200 ml    Filed Weights   04/01/12 0550  Weight: 69.4 kg (153 lb)    Exam: SLEEPING briefly arousable,  afebrile comfortable Cardiovascular: S1-S2 RRR.  Respiratory: Clinically clear  Abdomen: Soft nontender nondistended BS+ Skin: No lower extremity edema  Neurologic: Cannot fully assess however able to grip both hands and awakens enough to be agitated and wishes to move around in bed   Data Reviewed: Basic Metabolic Panel:  Recent Labs Lab 03/31/12 1005 04/01/12 0600 04/02/12 0915 04/02/12 1045 04/03/12 0630  NA 130* 133* 135  --  138  K 2.6* 3.3* 2.5*  --  3.1*  CL 88* 98 99  --  104  CO2 16* 21 25  --  23  GLUCOSE 137* 107* 104*  --  86  BUN 11 6 5*  --  5*  CREATININE 0.78  0.78 0.64 0.64  --  0.64  CALCIUM 9.9 9.1 8.9  --  8.8  MG 1.7  --   --  1.9  --   PHOS 4.6  --   --   --   --    Liver Function Tests:  Recent Labs Lab 03/31/12 1005 04/01/12 0600 04/02/12 1234  AST 34 41* 52*  ALT 19 20 22   ALKPHOS 73 61 56  BILITOT 0.6 0.8 0.5  PROT 7.9 7.0 6.6  ALBUMIN 4.2 3.7 3.3*    Recent Labs Lab 04/02/12 1234  LIPASE 19    Recent Labs Lab 03/31/12 1710  AMMONIA 45   CBC:  Recent Labs Lab 03/31/12 1005 04/01/12 0600 04/02/12 0915 04/03/12 0630  WBC 14.3* 14.3* 8.0 8.0  NEUTROABS 12.2*  --   --   --   HGB 15.8* 14.7 13.8 13.8  HCT 44.1 41.6 40.9 40.5  MCV 85.8 84.9 86.5 87.3  PLT 296 270 244 240   Cardiac Enzymes:  Recent Labs Lab 03/31/12 1006 03/31/12 1224  TROPONINI <0.30 <0.30   BNP (last 3 results) No results found for this basename: PROBNP,  in the last 8760 hours CBG: No results found for this basename: GLUCAP,  in the last 168 hours  Recent Results (from the past 240 hour(s))  CSF CULTURE     Status: None   Collection Time    04/01/12 10:06 AM      Result Value Range Status   Specimen Description CSF   Final   Special Requests NONE   Final   Gram Stain     Final   Value: CYTOSPIN WBC PRESENT, PREDOMINANTLY MONONUCLEAR      NO ORGANISMS SEEN   Culture NO GROWTH 2 DAYS   Final   Report Status PENDING   Incomplete     Studies: Ct Abdomen Pelvis W Contrast  04/02/2012  *RADIOLOGY REPORT*  Clinical Data: Abdominal pain.  Nausea vomiting.  CT ABDOMEN AND PELVIS WITH CONTRAST  Technique:  Multidetector CT imaging of the abdomen and pelvis was performed following the standard protocol during bolus administration of intravenous contrast.  Contrast: OMNIPAQUE IOHEXOL 300 MG/ML  SOLN  Comparison: 04/14/2009.  Findings: The lung bases are clear.  The liver is unremarkable.  No focal hepatic lesions or intrahepatic biliary dilatation.  A focal fatty change noted near the falciform ligament.  The gallbladder is normal.  No common bile duct dilatation.  The pancreas is normal.  The spleen is normal in size.  No focal lesions.  The left adrenal gland demonstrates mild nodularity which appears stable.  The right adrenal gland is normal.  There is a stable left renal calculus.  No hydronephrosis.  The stomach, duodenum, small bowel and colon are unremarkable. There is a small bowel intussusception in the left upper quadrant but this is likely benign and tree is seen.  There is no obstruction.  There is scattered colonic diverticulosis but no findings for acute diverticulitis.  The appendix is normal.  The aorta is normal in caliber.  No dissection.  No significant atherosclerotic changes.  The major branch vessels are patent.  No mesenteric or retroperitoneal mass or adenopathy.  The uterus is mildly enlarged and there are multiple fibroids. Both ovaries are normal.  No pelvic mass, adenopathy or free pelvic fluid collections.  No inguinal mass or hernia.  There is a small amount of air in the bladder which could be due to recent instrumentation.  The bony structures are unremarkable.  IMPRESSION:  1.  No acute abdominal/pelvic findings, mass lesions or adenopathy. 2.  Enlarged fibroid uterus. 3.  Left upper quadrant small bowel  intussusception, likely benign and transient.  No mass or obstruction.   Original Report Authenticated By: Rudie Meyer, M.D.    Dg Abd 2 Views  04/01/2012  *RADIOLOGY REPORT*  Clinical Data: Nausea and vomiting since Thursday.  Abdominal pain.  ABDOMEN - 2 VIEW  Comparison: 04/14/2009  Findings: Gas and stool throughout the colon.  No small or large bowel distension.  No free intra-abdominal air.  No abnormal air fluid levels.  Tiny calcification in the left abdomen may represent a small left renal stone.  This is stable in location and size. Calcified phleboliths in the pelvis.  Visualized bones appear intact.  No significant change since previous study.  IMPRESSION: Nonobstructive bowel gas pattern.  Calcification over the left kidney is stable.   Original Report Authenticated By: Burman Nieves, M.D.     Scheduled Meds: . acyclovir  500 mg Intravenous Q8H  . budesonide-formoterol  2 puff Inhalation BID  . DULoxetine  60 mg Oral Daily  . heparin  5,000 Units Subcutaneous Q8H  . irbesartan  75 mg Oral Daily  . levETIRAcetam  500 mg Oral BID  . metoprolol succinate  50 mg Oral Daily  . pantoprazole  40 mg Oral Q0600  . potassium chloride  40 mEq Oral BID  . sodium chloride  3 mL Intravenous Q12H   Continuous Infusions: . sodium chloride 100 mL/hr at 04/03/12 1130    Principal Problem:   Seizure Active Problems:   Abnormal EKG   Aortic anomaly   HTN (hypertension)   Hyponatremia   Viral illness   Altered mental status        Courtney Miles  [facility] Pager [phone] If 8PM-8AM, please contact night-coverage at [email or web address], password [facility] 04/03/2012, 3:11 PM  LOS: 3 days

## 2012-04-03 NOTE — Progress Notes (Signed)
Occupational Therapy Discharge Patient Details Name: Courtney Miles MRN: [id number] DOB: Nov 10, 1955 Today's Date: 04/03/2012 Time: 6962-9528 OT Time Calculation (min): 28 min  Patient discharged from OT services secondary to goals met and no further OT needs identified.  Please see latest therapy progress note for current level of functioning and progress toward goals.    Progress and discharge plan discussed with patient and/or caregiver: Patient/Caregiver agrees with plan  Pt educated to call RN staff and mobilize several times a day to help with activity tolerance prior to d/c home. Pt progressing well and needs to continue to mobilize.       Harrel Carina [place] Pager: [phone]  04/03/2012, 2:34 PM

## 2012-04-03 NOTE — Progress Notes (Signed)
Subjective: Continues to improve, feels that she is essentially back to baseline.   Exam: Filed Vitals:   04/03/12 0500  BP: 134/83  Pulse: 69  Temp: 97.9 F (36.6 C)  Resp: 18   Gen: In bed, NAD MS: Awake, Alert, oriented. Remembers my name from day previous.  JX:BJYNW, EOMI Motor: 5/5 throughout.  Sensory:intact to LT.  Impression:  57 yo F with gastrointestinal distress(n/v/d) for a day followed by seizure then persistent but improving confusion. Given the prolonged period of confusion following her seizure, other testing was performed including LP. Though there was no leukocytosis, given her prolonged confusion, I would favor continuing acyclovir until the HSV by PCR is back.   Recommendations: 1)Continue keppra and acyclovir pending HSV by PCR results.  2) If PCR is negative, would consider this a provoked seizure and can stop keppra as well as acyclovir.   Ritta Slot, MD [facility] [phone]  If 7pm- 7am, please page neurology on call at [phone].

## 2012-04-03 NOTE — Plan of Care (Signed)
Problem: Phase I Progression Outcomes Goal: Voiding-avoid urinary catheter unless indicated Outcome: Completed/Met Date Met:  04/03/12 Foley D/C'd--pt voiding without difficulty

## 2012-04-03 NOTE — Progress Notes (Signed)
Utilization review completed.  

## 2012-04-03 NOTE — Progress Notes (Signed)
ANTIBIOTIC CONSULT NOTE  Pharmacy Consult for Acyclovir Indication: r/o meningitis  Allergies  Allergen Reactions  . Sulfa Antibiotics Other (See Comments)    Unknown     Labs:  Recent Labs  03/31/12 1005 04/01/12 0600 04/02/12 0915 04/03/12 0630  WBC 14.3* 14.3* 8.0 8.0  HGB 15.8* 14.7 13.8 13.8  PLT 296 270 244 240  CREATININE 0.78  0.78 0.64 0.64  --    CrCl is unknown because there is no height on file for the current visit. No results found for this basename: VANCOTROUGH, Leodis Binet, VANCORANDOM, GENTTROUGH, GENTPEAK, GENTRANDOM, TOBRATROUGH, TOBRAPEAK, TOBRARND, AMIKACINPEAK, AMIKACINTROU, AMIKACIN,  in the last 72 hours   Microbiology: Recent Results (from the past 720 hour(s))  CSF CULTURE     Status: None   Collection Time    04/01/12 10:06 AM      Result Value Range Status   Specimen Description CSF   Final   Special Requests NONE   Final   Gram Stain     Final   Value: CYTOSPIN WBC PRESENT, PREDOMINANTLY MONONUCLEAR     NO ORGANISMS SEEN   Culture NO GROWTH 1 DAY   Final   Report Status PENDING   Incomplete    Medical History: Past Medical History  Diagnosis Date  . Hypertension   . Depression   . Arthritis     Medications:  Anti-infectives   Start     Dose/Rate Route Frequency Ordered Stop   03/31/12 1800  vancomycin (VANCOCIN) IVPB 1000 mg/200 mL premix  Status:  Discontinued     1,000 mg 200 mL/hr over 60 Minutes Intravenous Every 12 hours 03/31/12 1657 04/01/12 1131   03/31/12 1800  acyclovir (ZOVIRAX) 500 mg in dextrose 5 % 100 mL IVPB     500 mg 110 mL/hr over 60 Minutes Intravenous 3 times per day 03/31/12 1657     03/31/12 1730  cefTRIAXone (ROCEPHIN) 2 g in dextrose 5 % 50 mL IVPB  Status:  Discontinued     2 g 100 mL/hr over 30 Minutes Intravenous Every 12 hours 03/31/12 1657 04/01/12 1131     Assessment: 57 year old female admitted with seizures continues on acyclovir for rule out meningitis until HSV results.   Her renal  function is normal.    Goal of Therapy:  Appropriate dosing  Plan:  Continue Acyclovir 500mg  IV q8h (~10mg /kg q8h based on ideal body weight) Monitor renal function closely Follow-up CSF studies  Thank you. Okey Regal, PharmD [phone]  04/03/2012, 8:45 AM

## 2012-04-03 NOTE — Evaluation (Signed)
Physical Therapy Evaluation Patient Details Name: Courtney Miles MRN: [id number] DOB: 1955-12-19 Today's Date: 04/03/2012 Time: 1308-6578 PT Time Calculation (min): 29 min  PT Assessment / Plan / Recommendation Clinical Impression  pt admitted with viral illness and seizure.  On eval, all s/s have resolved and pt is at baseline functioning.  Pt is at I level, no followup.  Will sign off.    PT Assessment  Patent does not need any further PT services    Follow Up Recommendations  No PT follow up    Does the patient have the potential to tolerate intense rehabilitation      Barriers to Discharge        Equipment Recommendations  None recommended by PT    Recommendations for Other Services     Frequency      Precautions / Restrictions Precautions Precautions: None Restrictions Weight Bearing Restrictions: No   Pertinent Vitals/Pain       Mobility  Bed Mobility Bed Mobility: Supine to Sit;Sitting - Scoot to Edge of Bed Supine to Sit: 7: Independent Sitting - Scoot to Edge of Bed: 7: Independent Details for Bed Mobility Assistance: safe mobility Transfers Transfers: Sit to Stand;Stand to Sit Sit to Stand: 7: Independent Stand to Sit: 7: Independent Details for Transfer Assistance: safe mobility Ambulation/Gait Ambulation/Gait Assistance: 7: Independent Ambulation Distance (Feet): 300 Feet Ambulation/Gait Assistance Details: safe and steady even with higher level balance challenge Gait Pattern: Within Functional Limits Stairs: No    Exercises     PT Diagnosis:    PT Problem List:   PT Treatment Interventions:     PT Goals    Visit Information  Last PT Received On: 04/03/12 Assistance Needed: +1 PT/OT Co-Evaluation/Treatment: Yes    Subjective Data  Subjective: 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 KitchenMarland 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 Patient Stated Goal: ndependent   Prior Functioning  Home Living Lives With: Spouse Available Help at  Discharge: Family;Available PRN/intermittently Type of Home: House Home Access: Stairs to enter Home Layout: Multi-level Alternate Level Stairs-Rails: Can reach both Bathroom Shower/Tub: Tub/shower unit;Curtain Firefighter: Standard Home Adaptive Equipment: Grab bars in shower Additional Comments: Tax adviser Prior Function Level of Independence: Independent Able to Take Stairs?: Yes Driving: Yes Vocation: 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, advertising Overall Cognitive Status: Appears within functional limits for tasks assessed/performed Arousal/Alertness: Awake/alert Orientation Level: Appears intact for tasks assessed Behavior During Session: [facility] for tasks performed    Extremity/Trunk Assessment Right Upper Extremity Assessment RUE ROM/Strength/Tone: Within functional levels RUE Sensation: WFL - Light Touch RUE Coordination: WFL - gross/fine motor Left Upper Extremity Assessment LUE ROM/Strength/Tone: Within functional levels LUE Sensation: WFL - Light Touch LUE Coordination: WFL - gross/fine motor Right Lower Extremity Assessment RLE ROM/Strength/Tone: Within functional levels RLE Sensation: WFL - Light Touch RLE Coordination: WFL - gross/fine motor Left Lower Extremity Assessment LLE ROM/Strength/Tone: Within functional levels LLE Sensation: WFL - Light Touch LLE Coordination: WFL - gross/fine motor Trunk Assessment Trunk Assessment: Normal   Balance Balance Balance Assessed: Yes Static Sitting Balance Static Sitting - Balance Support: No upper extremity supported;Feet unsupported Static Sitting - Level of Assistance: 7: Independent High Level Balance High Level Balance Activites: Backward walking;Direction changes;Turns;Sudden stops;Head turns High Level Balance Comments: safe with mobility  End of Session PT - End of Session Equipment Utilized During Treatment: Gait belt Activity Tolerance: Patient tolerated  treatment well Patient left: with call bell/phone within reach  Nurse Communication: Mobility status  GP     Tola Meas, Eliseo Gum 04/03/2012, 2:39 PM  04/03/2012  Huguley Bing, PT [phone] (pager)

## 2012-04-04 LAB — VARICELLA-ZOSTER BY PCR: Varicella-Zoster, PCR: NOT DETECTED

## 2012-04-04 MED ORDER — PANTOPRAZOLE SODIUM 40 MG PO TBEC: 40.0000 mg | DELAYED_RELEASE_TABLET | Freq: Every day | ORAL | Status: DC

## 2012-04-04 MED ORDER — POTASSIUM CHLORIDE CRYS ER 20 MEQ PO TBCR
20.0000 meq | EXTENDED_RELEASE_TABLET | Freq: Two times a day (BID) | ORAL | Status: DC
  Administered 2012-04-04: 20 meq via ORAL
  Filled 2012-04-04: qty 1

## 2012-04-04 MED ORDER — PROMETHAZINE HCL 12.5 MG PO TABS: 12.5000 mg | ORAL_TABLET | ORAL | Status: DC | PRN

## 2012-04-04 NOTE — Progress Notes (Signed)
Subjective: No acute events, back to baseline.   Exam: Filed Vitals:   04/04/12 0500  BP: 135/89  Pulse: 74  Temp: 97.8 F (36.6 C)  Resp: 18   Gen: In bed, NAD MS: Awake, Alert, oriented and appropriate.  GN:FAOZH, EOMI Motor: 5/5 throughout.  Sensory:intact to LT.  Impression:  57 yo F with gastrointestinal distress(n/v/d) for a day followed by seizure then persistent but improving confusion. Given the prolonged period of confusion following her seizure, other testing was performed including LP. HSV by PCR is negatvie and I feel that we can discontinue therapy with both AED and anti-virals at this point. Given her electrolyte abnormalities on presentation I feel that this was a provoked seizure.   Recommendations: 1) Can D/C keppra and acyclovir.  2) Neurology will sign off at this time. Please call with any questions.   Ritta Slot, MD [facility] [phone]  If 7pm- 7am, please page neurology on call at [phone].

## 2012-04-04 NOTE — Discharge Summary (Signed)
Physician Discharge Summary  Courtney Miles FAO:[id number] DOB: 11-03-1955 DOA: 03/31/2012  PCP: Provider Not In System  Admit date: 03/31/2012 Discharge date: 04/04/2012  Time spent: 35  minutes  Recommendations for Outpatient Follow-up:  Follow up with PCP in one week and get BMP to check potassium.  Discharge Diagnoses:  Principal Problem:   Seizure Active Problems:   Abnormal EKG   Aortic anomaly   HTN (hypertension)   Hyponatremia   Viral illness   Altered mental status   Discharge Condition: stable  Diet recommendation: low sodium diet  Filed Weights   04/01/12 0550 04/04/12 0500  Weight: 69.4 kg (153 lb) 69.582 kg (153 lb 6.4 oz)    History of present illness:  Courtney Miles is a 57 y.o. female who was in her normal state of health until yesterday when she awoke with nausea/vomiting. She had eaten some ice cream the night before that she thought tasted funny. She had severe n/v/d all day, but was able to get some fluids down. She then this morning was seen to have a 1 minute long tonic seizure per her husband. He does not describe any focality. She then was transported by EMS who saw another generalized seizure en route. She required versed and a total of 6 mg ativan in the ER to stop the activity. She has continued to be confused, but is steadily getting better per her husband. She has significant electrolyte abnormalities that suggest that they could play a role in the seizure.  She also has a history of encephalitis in 1977, and was in the hospital overnight and released soon thereafter. She had a seizure at that time as well   Hospital Course:   1. Seizure: possibly from metabolic abnormalities. She received benzodiazepines and her seizure resolved.  - no seizure activity since admission.  - EEG - normal.  - MRI brain ordered And showed no visible abnormality.  - keppra 500mg  BID ordered initially and discontinued as she did not have any sezure activity on EEG.   -  2. Nausea/ Vomiting: probably viral gastroenteritis. Almost resolved. Antiemetics.And started her on clear liquid diet  And advanced diet as tolerated. abd films and ct abd pelvis did not show any significant pathology.  3. Hyponatremia: resolved.  4. Encephalopathy: of unclear etiology probably metabolic in view of the hyponatremia. She is also being treated empirically for viral meningitis with vancomycin, rocepin and IV acyclovir. LP was done on 2/22 and CSF analysis showed only 1 wbc , we are going to stop vancomycin and rocephin and we have continued with IV acyclovir till we got HSV results. HSV negative. And acyclovir discontinued.  5. Hypertension: controlled.  6. Hypokalemia: being repleted as needed. Magnesium level within normal limits. Recommend checking BMP in one week.  7. Abdominal pain: unclear etiology. Resolved. Liver function tests and lipase ordered and they were normal. Lactic acid within normal limits.   Consultations:  neurology  Discharge Exam: Filed Vitals:   04/03/12 1500 04/03/12 2055 04/04/12 0500 04/04/12 0918  BP: 116/65 149/76 135/89   Pulse: 89 72 74   Temp: 98.8 F (37.1 C) 98.5 F (36.9 C) 97.8 F (36.6 C)   TempSrc: Oral Oral Oral   Resp: 18 18 18    Weight:   69.582 kg (153 lb 6.4 oz)   SpO2: 96% 99% 97% 93%    Alert afebrile in good spirits.  Cardiovascular: S1-S2 RRR.  Respiratory: Clinically clear  Abdomen: Soft nontender nondistended BS+  Skin: No  lower extremity edema Neuro no focal deficits.    Discharge Instructions      Discharge Orders   Future Appointments Provider Department Dept Phone   04/06/2012 5:20 PM [facility] 1 [facility] [phone]   Patient should wear two piece clothing and wear no powder or deodorant. Patient should arrive 15 minutes early.   Future Orders Complete By Expires     Diet - low sodium heart healthy  As directed     Discharge instructions  As directed     Comments:       Follow up with PCP as needed.        Medication List    TAKE these medications       acetaminophen 500 MG tablet  Commonly known as:  TYLENOL  Take 500 mg by mouth every 6 (six) hours as needed for pain.     albuterol 108 (90 BASE) MCG/ACT inhaler  Commonly known as:  PROVENTIL HFA;VENTOLIN HFA  Inhale 2 puffs into the lungs every 6 (six) hours as needed for wheezing.     bismuth subsalicylate 262 MG/15ML suspension  Commonly known as:  PEPTO BISMOL  Take 30 mLs by mouth every 4 (four) hours as needed for indigestion.     budesonide-formoterol 80-4.5 MCG/ACT inhaler  Commonly known as:  SYMBICORT  Inhale 2 puffs into the lungs 2 (two) times daily.     dimenhyDRINATE 50 MG tablet  Commonly known as:  DRAMAMINE  Take 25 mg by mouth every 8 (eight) hours as needed.     DULoxetine 60 MG capsule  Commonly known as:  CYMBALTA  Take 60 mg by mouth daily.     metoprolol succinate 50 MG 24 hr tablet  Commonly known as:  TOPROL-XL  Take 50 mg by mouth daily. Take with or immediately following a meal.     pantoprazole 40 MG tablet  Commonly known as:  PROTONIX  Take 1 tablet (40 mg total) by mouth daily at 6 (six) AM.     PEDIALYTE Soln  Take 30 mLs by mouth every 4 (four) hours.     promethazine 12.5 MG tablet  Commonly known as:  PHENERGAN  Take 1 tablet (12.5 mg total) by mouth every 4 (four) hours as needed for nausea.     valsartan 160 MG tablet  Commonly known as:  DIOVAN  Take 160 mg by mouth daily.     vitamin B-12 500 MCG tablet  Commonly known as:  CYANOCOBALAMIN  Take 500 mcg by mouth daily.     Vitamin D 2000 UNITS tablet  Take 4,000 Units by mouth daily.       Follow-up Information   Follow up with Provider Not In System. Schedule an appointment as soon as possible for a visit in 1 week. (please get  BMP to check potassium)        The results of significant diagnostics from this hospitalization (including imaging, microbiology, ancillary and  laboratory) are listed below for reference.    Significant Diagnostic Studies: Ct Head Wo Contrast  03/31/2012  *RADIOLOGY REPORT*  Clinical Data: Seizure-like activity.  Postictal presentation.  CT HEAD WITHOUT CONTRAST  Technique:  Contiguous axial images were obtained from the base of the skull through the vertex without contrast.  Comparison: MRI 03/30/2004.  CT head 09/26/2003.  Findings: No mass lesion, mass effect, midline shift, hydrocephalus, hemorrhage.  No territorial ischemia or acute infarction.  Mastoid air cells are clear.  The paranasal sinuses appear  clear.  Hypoplastic frontal sinuses incidentally noted.  IMPRESSION: Negative CT head.   Original Report Authenticated By: Andreas Newport, M.D.    Mr Brain Wo Contrast  04/01/2012  *RADIOLOGY REPORT*  Clinical Data: Seizure.  MRI HEAD WITHOUT CONTRAST  Technique:  Multiplanar, multiecho pulse sequences of the brain and surrounding structures were obtained according to standard protocol without intravenous contrast.  Comparison: Head CT 03/31/2012.  MRI 04/07/2004.  Findings: The exam suffers from some motion degradation.  Diffusion imaging does not show any acute or subacute infarction.  Routine pulse sequences show a normal appearance of the brain.  No evidence of old infarction, mass lesion, hemorrhage, hydrocephalus or extra- axial collection.  No mesial temporal abnormality.  No pituitary mass.  No inflammatory sinus disease.  No skull or skull base lesion.  IMPRESSION: Motion degraded examination.  No visible abnormality.   Original Report Authenticated By: Paulina Fusi, M.D.    Ct Abdomen Pelvis W Contrast  04/02/2012  *RADIOLOGY REPORT*  Clinical Data: Abdominal pain.  Nausea vomiting.  CT ABDOMEN AND PELVIS WITH CONTRAST  Technique:  Multidetector CT imaging of the abdomen and pelvis was performed following the standard protocol during bolus administration of intravenous contrast.  Contrast: OMNIPAQUE IOHEXOL 300 MG/ML  SOLN   Comparison: 04/14/2009.  Findings: The lung bases are clear.  The liver is unremarkable.  No focal hepatic lesions or intrahepatic biliary dilatation.  A focal fatty change noted near the falciform ligament.  The gallbladder is normal.  No common bile duct dilatation.  The pancreas is normal.  The spleen is normal in size.  No focal lesions.  The left adrenal gland demonstrates mild nodularity which appears stable.  The right adrenal gland is normal.  There is a stable left renal calculus.  No hydronephrosis.  The stomach, duodenum, small bowel and colon are unremarkable. There is a small bowel intussusception in the left upper quadrant but this is likely benign and tree is seen.  There is no obstruction.  There is scattered colonic diverticulosis but no findings for acute diverticulitis.  The appendix is normal.  The aorta is normal in caliber.  No dissection.  No significant atherosclerotic changes.  The major branch vessels are patent.  No mesenteric or retroperitoneal mass or adenopathy.  The uterus is mildly enlarged and there are multiple fibroids. Both ovaries are normal.  No pelvic mass, adenopathy or free pelvic fluid collections.  No inguinal mass or hernia.  There is a small amount of air in the bladder which could be due to recent instrumentation.  The bony structures are unremarkable.  IMPRESSION:  1.  No acute abdominal/pelvic findings, mass lesions or adenopathy. 2.  Enlarged fibroid uterus. 3.  Left upper quadrant small bowel intussusception, likely benign and transient.  No mass or obstruction.   Original Report Authenticated By: Rudie Meyer, M.D.    Dg Abd 2 Views  04/01/2012  *RADIOLOGY REPORT*  Clinical Data: Nausea and vomiting since Thursday.  Abdominal pain.  ABDOMEN - 2 VIEW  Comparison: 04/14/2009  Findings: Gas and stool throughout the colon.  No small or large bowel distension.  No free intra-abdominal air.  No abnormal air fluid levels.  Tiny calcification in the left abdomen may  represent a small left renal stone.  This is stable in location and size. Calcified phleboliths in the pelvis.  Visualized bones appear intact.  No significant change since previous study.  IMPRESSION: Nonobstructive bowel gas pattern.  Calcification over the left kidney is stable.  Original Report Authenticated By: Burman Nieves, M.D.    Dg Fluoro Guide Lumbar Puncture  04/01/2012  *RADIOLOGY REPORT*  Clinical Data:  Altered mental status.  DIAGNOSTIC LUMBAR PUNCTURE UNDER FLUOROSCOPIC GUIDANCE  Fluoroscopy time:  56 seconds.  Technique:  Informed consent was obtained from the patient prior to the procedure, including potential complications of headache, allergy, and pain.   With the patient prone, the lower back was prepped with Betadine.  1% Lidocaine was used for local anesthesia.  Lumbar puncture was performed at the L3-4 level using a 20 gauge needle with return of clear CSF with an opening pressure of 10 cm water.   13 ml of CSF were obtained for laboratory studies.  The patient tolerated the procedure well and there were no apparent complications.  IMPRESSION: Lumbar puncture performed under fluoroscopy.   Original Report Authenticated By: Irish Lack, M.D.     Microbiology: Recent Results (from the past 240 hour(s))  CSF CULTURE     Status: None   Collection Time    04/01/12 10:06 AM      Result Value Range Status   Specimen Description CSF   Final   Special Requests NONE   Final   Gram Stain     Final   Value: CYTOSPIN WBC PRESENT, PREDOMINANTLY MONONUCLEAR     NO ORGANISMS SEEN   Culture NO GROWTH 2 DAYS   Final   Report Status PENDING   Incomplete     Labs: Basic Metabolic Panel:  Recent Labs Lab 03/31/12 1005 04/01/12 0600 04/02/12 0915 04/02/12 1045 04/03/12 0630  NA 130* 133* 135  --  138  K 2.6* 3.3* 2.5*  --  3.1*  CL 88* 98 99  --  104  CO2 16* 21 25  --  23  GLUCOSE 137* 107* 104*  --  86  BUN 11 6 5*  --  5*  CREATININE 0.78  0.78 0.64 0.64  --  0.64   CALCIUM 9.9 9.1 8.9  --  8.8  MG 1.7  --   --  1.9  --   PHOS 4.6  --   --   --   --    Liver Function Tests:  Recent Labs Lab 03/31/12 1005 04/01/12 0600 04/02/12 1234  AST 34 41* 52*  ALT 19 20 22   ALKPHOS 73 61 56  BILITOT 0.6 0.8 0.5  PROT 7.9 7.0 6.6  ALBUMIN 4.2 3.7 3.3*    Recent Labs Lab 04/02/12 1234  LIPASE 19    Recent Labs Lab 03/31/12 1710  AMMONIA 45   CBC:  Recent Labs Lab 03/31/12 1005 04/01/12 0600 04/02/12 0915 04/03/12 0630  WBC 14.3* 14.3* 8.0 8.0  NEUTROABS 12.2*  --   --   --   HGB 15.8* 14.7 13.8 13.8  HCT 44.1 41.6 40.9 40.5  MCV 85.8 84.9 86.5 87.3  PLT 296 270 244 240   Cardiac Enzymes:  Recent Labs Lab 03/31/12 1006 03/31/12 1224  TROPONINI <0.30 <0.30   BNP: BNP (last 3 results) No results found for this basename: PROBNP,  in the last 8760 hours CBG: No results found for this basename: GLUCAP,  in the last 168 hours     Signed:  Connor Meacham  [facility] 04/04/2012, 10:17 AM

## 2012-04-04 NOTE — Care Management Note (Signed)
    Page 1 of 1   04/04/2012     2:54:49 PM   CARE MANAGEMENT NOTE 04/04/2012  Patient:  Courtney Miles, Courtney Miles   Account Number:  [id number]  Date Initiated:  03/31/2012  Documentation initiated by:  Donn Pierini  Subjective/Objective Assessment:   Pt admitted with new onset Sz     Action/Plan:   PTA pt lived at home with family, NCM to follow for d/c needs   Anticipated DC Date:  04/03/2012   Anticipated DC Plan:  HOME W HOME HEALTH SERVICES      DC Planning Services  CM consult      Choice offered to / List presented to:             Status of service:  Completed, signed off Medicare Important Message given?   (If response is "NO", the following Medicare IM given date fields will be blank) Date Medicare IM given:   Date Additional Medicare IM given:    Discharge Disposition:  HOME/SELF CARE  Per UR Regulation:  Reviewed for med. necessity/level of care/duration of stay  If discussed at Long Length of Stay Meetings, dates discussed:    Comments:  04/04/12-  1450- Donn Pierini RN, BSN [phone] Pt for discharge today- no d/c needs

## 2012-04-05 LAB — CSF CULTURE W GRAM STAIN: Culture: NO GROWTH

## 2012-04-06 ENCOUNTER — Ambulatory Visit (HOSPITAL_COMMUNITY): Admission: RE | Admit: 2012-04-06 | Payer: BC Managed Care – PPO | Source: Ambulatory Visit

## 2012-04-13 ENCOUNTER — Ambulatory Visit (HOSPITAL_COMMUNITY): Payer: BC Managed Care – PPO

## 2012-04-20 ENCOUNTER — Ambulatory Visit (HOSPITAL_COMMUNITY)
Admission: RE | Admit: 2012-04-20 | Discharge: 2012-04-20 | Disposition: A | Discharge status: Home / Self Care | Payer: BC Managed Care – PPO | Source: Ambulatory Visit | Attending: Family Medicine | Admitting: Family Medicine

## 2012-04-20 DIAGNOSIS — Z1231 Encounter for screening mammogram for malignant neoplasm of breast: Secondary | ICD-10-CM | POA: Insufficient documentation

## 2012-06-05 ENCOUNTER — Emergency Department (HOSPITAL_COMMUNITY)
Admission: EM | Admit: 2012-06-05 | Discharge: 2012-06-05 | Disposition: A | Discharge status: Home / Self Care | Payer: BC Managed Care – PPO | Attending: Emergency Medicine | Admitting: Emergency Medicine

## 2012-06-05 ENCOUNTER — Encounter (HOSPITAL_COMMUNITY): Payer: Self-pay | Admitting: Emergency Medicine

## 2012-06-05 DIAGNOSIS — R51 Headache: Secondary | ICD-10-CM | POA: Insufficient documentation

## 2012-06-05 DIAGNOSIS — I10 Essential (primary) hypertension: Secondary | ICD-10-CM | POA: Insufficient documentation

## 2012-06-05 DIAGNOSIS — R509 Fever, unspecified: Secondary | ICD-10-CM | POA: Insufficient documentation

## 2012-06-05 DIAGNOSIS — J029 Acute pharyngitis, unspecified: Secondary | ICD-10-CM | POA: Insufficient documentation

## 2012-06-05 DIAGNOSIS — R112 Nausea with vomiting, unspecified: Secondary | ICD-10-CM | POA: Insufficient documentation

## 2012-06-05 DIAGNOSIS — R197 Diarrhea, unspecified: Secondary | ICD-10-CM | POA: Insufficient documentation

## 2012-06-05 DIAGNOSIS — F3289 Other specified depressive episodes: Secondary | ICD-10-CM | POA: Insufficient documentation

## 2012-06-05 DIAGNOSIS — R109 Unspecified abdominal pain: Secondary | ICD-10-CM | POA: Insufficient documentation

## 2012-06-05 DIAGNOSIS — K529 Noninfective gastroenteritis and colitis, unspecified: Secondary | ICD-10-CM

## 2012-06-05 DIAGNOSIS — F329 Major depressive disorder, single episode, unspecified: Secondary | ICD-10-CM | POA: Insufficient documentation

## 2012-06-05 DIAGNOSIS — Z79899 Other long term (current) drug therapy: Secondary | ICD-10-CM | POA: Insufficient documentation

## 2012-06-05 DIAGNOSIS — K5289 Other specified noninfective gastroenteritis and colitis: Secondary | ICD-10-CM | POA: Insufficient documentation

## 2012-06-05 DIAGNOSIS — Z8739 Personal history of other diseases of the musculoskeletal system and connective tissue: Secondary | ICD-10-CM | POA: Insufficient documentation

## 2012-06-05 DIAGNOSIS — Z87891 Personal history of nicotine dependence: Secondary | ICD-10-CM | POA: Insufficient documentation

## 2012-06-05 HISTORY — DX: Noninfective gastroenteritis and colitis, unspecified: K52.9

## 2012-06-05 LAB — COMPREHENSIVE METABOLIC PANEL
BUN: 11 mg/dL (ref 6–23)
CO2: 22 mEq/L (ref 19–32)
Calcium: 9.8 mg/dL (ref 8.4–10.5)
Creatinine, Ser: 0.62 mg/dL (ref 0.50–1.10)
GFR calc Af Amer: >90 mL/min (ref >90)
GFR calc non Af Amer: >90 mL/min (ref >90)
Glucose, Bld: 164 mg/dL — ABNORMAL HIGH (ref 70–99)

## 2012-06-05 LAB — CBC WITH DIFFERENTIAL/PLATELET
Basophils Relative: 0 % (ref 0–1)
Hemoglobin: 15.8 g/dL — ABNORMAL HIGH (ref 12.0–15.0)
Lymphs Abs: 1.8 10*3/uL (ref 0.7–4.0)
MCHC: 35.8 g/dL (ref 30.0–36.0)
Monocytes Relative: 4 % (ref 3–12)
Neutro Abs: 7.5 10*3/uL (ref 1.7–7.7)
Neutrophils Relative %: 77 % (ref 43–77)
RBC: 5.21 MIL/uL — ABNORMAL HIGH (ref 3.87–5.11)

## 2012-06-05 MED ORDER — ONDANSETRON HCL 4 MG PO TABS: 4.0000 mg | ORAL_TABLET | Freq: Four times a day (QID) | ORAL | Status: DC

## 2012-06-05 MED ORDER — SODIUM CHLORIDE 0.9 % IV SOLN
Freq: Once | INTRAVENOUS | Status: AC
  Administered 2012-06-05: 19:00:00 via INTRAVENOUS

## 2012-06-05 MED ORDER — ONDANSETRON 4 MG PO TBDP
ORAL_TABLET | ORAL | Status: AC
  Administered 2012-06-05: 8 mg via ORAL
  Filled 2012-06-05: qty 2

## 2012-06-05 MED ORDER — ONDANSETRON 4 MG PO TBDP: 8.0000 mg | ORAL_TABLET | Freq: Once | ORAL | Status: AC

## 2012-06-05 MED ORDER — POTASSIUM CHLORIDE 10 MEQ/100ML IV SOLN
10.0000 meq | Freq: Once | INTRAVENOUS | Status: AC
  Administered 2012-06-05: 10 meq via INTRAVENOUS
  Filled 2012-06-05: qty 100

## 2012-06-05 MED ORDER — OMEPRAZOLE 20 MG PO CPDR: 20.0000 mg | DELAYED_RELEASE_CAPSULE | Freq: Every day | ORAL | Status: DC

## 2012-06-05 MED ORDER — HYDROCODONE-ACETAMINOPHEN 5-325 MG PO TABS: 1.0000 | ORAL_TABLET | ORAL | Status: DC | PRN

## 2012-06-05 MED ORDER — SODIUM CHLORIDE 0.9 % IV BOLUS (SEPSIS)
500.0000 mL | Freq: Once | INTRAVENOUS | Status: AC
  Administered 2012-06-05: 500 mL via INTRAVENOUS

## 2012-06-05 MED ORDER — ONDANSETRON HCL 4 MG/2ML IJ SOLN
4.0000 mg | Freq: Once | INTRAMUSCULAR | Status: AC
  Administered 2012-06-05: 4 mg via INTRAVENOUS
  Filled 2012-06-05: qty 2

## 2012-06-05 MED ORDER — MORPHINE SULFATE 4 MG/ML IJ SOLN
2.0000 mg | Freq: Once | INTRAMUSCULAR | Status: AC
  Administered 2012-06-05: 2 mg via INTRAVENOUS
  Filled 2012-06-05: qty 1

## 2012-06-05 MED ORDER — MORPHINE SULFATE 2 MG/ML IJ SOLN
2.0000 mg | Freq: Once | INTRAMUSCULAR | Status: AC
  Administered 2012-06-05: 2 mg via INTRAVENOUS
  Filled 2012-06-05: qty 1

## 2012-06-05 NOTE — ED Notes (Signed)
Pt ambulated to restroom with no issues.  

## 2012-06-05 NOTE — ED Notes (Signed)
Pt c/o n/v/d that started early this morning, pt reports she is unable to keep any food or fluids down. Pt c/o mid abd pain, pt reports it feels the same as last time she was here and dx with gastroenteritis. Pt reports her labs became abnormal and then had a couple seizures. Pt denies hx seizures and wasn't prescribed seizure meds to go home. Pt tried taking phenergan this morning but no relief.

## 2012-06-05 NOTE — ED Notes (Signed)
Pt. And pt.s family member came up to the window.  Pt.s family member began to yell and use profanity..  Explained to pt. That we had to register the pt. First.  [employer] had to calm pt.s family member down.

## 2012-06-05 NOTE — ED Notes (Signed)
Family member came to desk acquring about priority and how do we decide when the pt. Goes back.   Explained to pt.. Our triage system, and encouraged pt. To stay and reassured pt. That we are doing everything we can do.

## 2012-06-05 NOTE — ED Notes (Signed)
Pt c/o abd pain with N/V/D starting this am; pt sts hx of same with gastroenteritis; pt dry heaving at present

## 2012-06-05 NOTE — ED Provider Notes (Signed)
History     CSN: [id number]  Arrival date & time 06/05/12  1242   First MD Initiated Contact with Patient 06/05/12 1712      Chief Complaint  Patient presents with  . Abdominal Pain  . Emesis  . Diarrhea    (Consider location/radiation/quality/duration/timing/severity/associated sxs/prior treatment) HPI  Pt is a 57 yo F s/p 2 cesarean sections presenting with day long nausea, non-bloody non bilious vomiting, non-bloody diarrhea w/ epigastric pain w/o radiation that is sharp and worsening w/ episodes of vomiting or dry heaving. Associated subjective fevers, chills, and headache. Denies possible contaminated food source or sick contacts at home. Denies urinary symptoms or pelvic complaints.   Past Medical History  Diagnosis Date  . Hypertension   . Depression   . Arthritis   . Gastroenteritis     History reviewed. No pertinent past surgical history.  Family History  Problem Relation Age of Onset  . Angina Mother   . Heart attack Mother   . Diabetic kidney disease Sister   . Seizures Sister     History  Substance Use Topics  . Smoking status: Former Games developer  . Smokeless tobacco: Not on file  . Alcohol Use: 0.5 oz/week    1 drink(s) per week    OB History   Grav Para Term Preterm Abortions TAB SAB Ect Mult Living                  Review of Systems  Constitutional: Positive for fever and chills.  HENT: Positive for sore throat.   Eyes: Negative for visual disturbance.  Respiratory: Negative for shortness of breath.   Cardiovascular: Negative for chest pain.  Gastrointestinal: Positive for nausea, vomiting, abdominal pain and diarrhea. Negative for blood in stool, abdominal distention and anal bleeding.  Genitourinary: Negative for dysuria and vaginal pain.  Musculoskeletal: Negative for back pain.  Skin: Negative for rash.  Neurological: Positive for headaches.    Allergies  Sulfa antibiotics  Home Medications   Current Outpatient Rx  Name  Route  Sig   Dispense  Refill  . acetaminophen (TYLENOL) 500 MG tablet   Oral   Take 500 mg by mouth every 6 (six) hours as needed for pain.         Marland Kitchen albuterol (PROVENTIL HFA;VENTOLIN HFA) 108 (90 BASE) MCG/ACT inhaler   Inhalation   Inhale 2 puffs into the lungs every 6 (six) hours as needed for wheezing.         . bismuth subsalicylate (PEPTO BISMOL) 262 MG/15ML suspension   Oral   Take 30 mLs by mouth every 4 (four) hours as needed for indigestion.         . budesonide-formoterol (SYMBICORT) 80-4.5 MCG/ACT inhaler   Inhalation   Inhale 2 puffs into the lungs 2 (two) times daily.         . Cholecalciferol (VITAMIN D) 2000 UNITS tablet   Oral   Take 4,000 Units by mouth daily.         Marland Kitchen dimenhyDRINATE (DRAMAMINE) 50 MG tablet   Oral   Take 25 mg by mouth every 8 (eight) hours as needed.          . DULoxetine (CYMBALTA) 60 MG capsule   Oral   Take 60 mg by mouth daily.         . metoprolol succinate (TOPROL-XL) 50 MG 24 hr tablet   Oral   Take 50 mg by mouth daily. Take with or immediately following a meal.         .  pantoprazole (PROTONIX) 40 MG tablet   Oral   Take 1 tablet (40 mg total) by mouth daily at 6 (six) AM.   30 tablet   0   . PEDIALYTE (PEDIALYTE) SOLN   Oral   Take 30 mLs by mouth every 4 (four) hours.         . promethazine (PHENERGAN) 12.5 MG tablet   Oral   Take 1 tablet (12.5 mg total) by mouth every 4 (four) hours as needed for nausea.   30 tablet   0   . valsartan (DIOVAN) 160 MG tablet   Oral   Take 160 mg by mouth daily.         . vitamin B-12 (CYANOCOBALAMIN) 500 MCG tablet   Oral   Take 500 mcg by mouth daily.           BP 148/77  Pulse 74  Temp(Src) 98 F (36.7 C) (Oral)  Resp 18  SpO2 100%  Physical Exam  Constitutional: She is oriented to person, place, and time. She appears well-developed and well-nourished. No distress.  HENT:  Head: Normocephalic and atraumatic.  Mouth/Throat: Oropharynx is clear and moist.  Mucous membranes are not pale, dry and not cyanotic. No oropharyngeal exudate.  Eyes: Conjunctivae are normal.  Neck: Normal range of motion. Neck supple.  Cardiovascular: Normal rate, regular rhythm and normal heart sounds.   Pulmonary/Chest: Effort normal and breath sounds normal. No respiratory distress. She exhibits no tenderness.  Abdominal: Soft. Bowel sounds are normal. She exhibits no distension. There is tenderness in the epigastric area. There is no rebound, no guarding and no CVA tenderness.  Neurological: She is alert and oriented to person, place, and time.  Skin: Skin is warm and dry. No rash noted. She is not diaphoretic.    ED Course  Procedures (including critical care time)  Medications  ondansetron (ZOFRAN-ODT) disintegrating tablet 8 mg (8 mg Oral Given 06/05/12 1257)  ondansetron (ZOFRAN) injection 4 mg (4 mg Intravenous Given 06/05/12 1733)  0.9 %  sodium chloride infusion ( Intravenous New Bag/Given 06/05/12 1840)  sodium chloride 0.9 % bolus 500 mL (0 mLs Intravenous Stopped 06/05/12 1839)  potassium chloride 10 mEq in 100 mL IVPB (0 mEq Intravenous Stopped 06/05/12 1840)  morphine 4 MG/ML injection 2 mg (2 mg Intravenous Given 06/05/12 1733)  morphine 4 MG/ML injection 2 mg (2 mg Intravenous Given 06/05/12 1814)    Patient is declining further imaging evaluation at this time.  Symptoms are resolving in the ED with management. Abdomen remains soft, mildly tender, w/o distention, guarding, or rebound.  Able to tolerate PO intake in ED.   Labs Reviewed  COMPREHENSIVE METABOLIC PANEL - Abnormal; Notable for the following:    Potassium 3.4 (*)    Glucose, Bld 164 (*)    All other components within normal limits  CBC WITH DIFFERENTIAL - Abnormal; Notable for the following:    RBC 5.21 (*)    Hemoglobin 15.8 (*)    All other components within normal limits  LIPASE, BLOOD   No results found.   1. Nausea & vomiting   2. Gastroenteritis       MDM  Patient with  symptoms consistent with viral gastroenteritis.  Vitals are stable, no fever.  No signs of dehydration, tolerating PO fluids > 6 oz.  Lungs are clear.  No focal abdominal pain, no concern for appendicitis, cholecystitis, pancreatitis, ruptured viscus, UTI, kidney stone, or any other abdominal etiology.  Supportive therapy indicated with return if  symptoms worsen.  Patient counseled. Advised to follow up with PCP and GI. Patient agreeable to plan. Patient is stable at time of discharge Patient d/w with Dr. Ignacia Palma, agrees with plan.           Jeannetta Ellis, PA-C 06/05/12 2040

## 2012-06-05 NOTE — ED Notes (Signed)
Pt drinking ginger ale  

## 2012-06-06 NOTE — ED Provider Notes (Signed)
Medical screening examination/treatment/procedure(s) were performed by non-physician practitioner and as supervising physician I was immediately available for consultation/collaboration.   Carleene Cooper III, MD 06/06/12 1053

## 2012-06-08 ENCOUNTER — Other Ambulatory Visit: Payer: Self-pay | Admitting: Gastroenterology

## 2012-06-08 DIAGNOSIS — R109 Unspecified abdominal pain: Secondary | ICD-10-CM

## 2012-06-09 ENCOUNTER — Other Ambulatory Visit: Payer: Self-pay | Admitting: Gastroenterology

## 2012-06-09 ENCOUNTER — Ambulatory Visit
Admission: RE | Admit: 2012-06-09 | Discharge: 2012-06-09 | Disposition: A | Discharge status: Home / Self Care | Payer: BC Managed Care – PPO | Source: Ambulatory Visit | Attending: Gastroenterology | Admitting: Gastroenterology

## 2012-06-09 DIAGNOSIS — R1013 Epigastric pain: Secondary | ICD-10-CM

## 2012-06-09 DIAGNOSIS — R109 Unspecified abdominal pain: Secondary | ICD-10-CM

## 2012-06-15 ENCOUNTER — Other Ambulatory Visit: Payer: BC Managed Care – PPO

## 2013-10-07 ENCOUNTER — Encounter: Payer: Self-pay | Admitting: *Deleted

## 2015-06-30 ENCOUNTER — Other Ambulatory Visit (HOSPITAL_COMMUNITY)
Admission: RE | Admit: 2015-06-30 | Discharge: 2015-06-30 | Disposition: A | Discharge status: Home / Self Care | Payer: BLUE CROSS/BLUE SHIELD | Source: Ambulatory Visit | Attending: Family Medicine | Admitting: Family Medicine

## 2015-06-30 ENCOUNTER — Other Ambulatory Visit: Payer: Self-pay | Admitting: Family Medicine

## 2015-06-30 DIAGNOSIS — Z124 Encounter for screening for malignant neoplasm of cervix: Secondary | ICD-10-CM | POA: Insufficient documentation

## 2015-07-01 LAB — CYTOLOGY - PAP

## 2016-07-08 ENCOUNTER — Other Ambulatory Visit: Payer: Self-pay | Admitting: Family Medicine

## 2016-07-08 DIAGNOSIS — Z1231 Encounter for screening mammogram for malignant neoplasm of breast: Secondary | ICD-10-CM

## 2017-04-30 ENCOUNTER — Encounter (HOSPITAL_COMMUNITY): Payer: Self-pay

## 2017-04-30 ENCOUNTER — Inpatient Hospital Stay (HOSPITAL_COMMUNITY)
Admission: EM | Admit: 2017-04-30 | Discharge: 2017-05-04 | DRG: 897 | Disposition: A | Discharge status: Home / Self Care | Payer: BLUE CROSS/BLUE SHIELD | Attending: Internal Medicine | Admitting: Internal Medicine

## 2017-04-30 ENCOUNTER — Other Ambulatory Visit: Payer: Self-pay

## 2017-04-30 ENCOUNTER — Emergency Department (HOSPITAL_COMMUNITY): Payer: BLUE CROSS/BLUE SHIELD

## 2017-04-30 DIAGNOSIS — Z820 Family history of epilepsy and other diseases of the nervous system: Secondary | ICD-10-CM

## 2017-04-30 DIAGNOSIS — Z79899 Other long term (current) drug therapy: Secondary | ICD-10-CM

## 2017-04-30 DIAGNOSIS — R011 Cardiac murmur, unspecified: Secondary | ICD-10-CM | POA: Diagnosis not present

## 2017-04-30 DIAGNOSIS — R4182 Altered mental status, unspecified: Secondary | ICD-10-CM

## 2017-04-30 DIAGNOSIS — R112 Nausea with vomiting, unspecified: Secondary | ICD-10-CM | POA: Diagnosis present

## 2017-04-30 DIAGNOSIS — M1990 Unspecified osteoarthritis, unspecified site: Secondary | ICD-10-CM | POA: Diagnosis present

## 2017-04-30 DIAGNOSIS — E860 Dehydration: Secondary | ICD-10-CM | POA: Diagnosis present

## 2017-04-30 DIAGNOSIS — D72829 Elevated white blood cell count, unspecified: Secondary | ICD-10-CM | POA: Diagnosis present

## 2017-04-30 DIAGNOSIS — R32 Unspecified urinary incontinence: Secondary | ICD-10-CM | POA: Diagnosis present

## 2017-04-30 DIAGNOSIS — G40909 Epilepsy, unspecified, not intractable, without status epilepticus: Secondary | ICD-10-CM

## 2017-04-30 DIAGNOSIS — K561 Intussusception: Secondary | ICD-10-CM | POA: Diagnosis present

## 2017-04-30 DIAGNOSIS — D72825 Bandemia: Secondary | ICD-10-CM

## 2017-04-30 DIAGNOSIS — E871 Hypo-osmolality and hyponatremia: Secondary | ICD-10-CM | POA: Diagnosis present

## 2017-04-30 DIAGNOSIS — I351 Nonrheumatic aortic (valve) insufficiency: Secondary | ICD-10-CM | POA: Diagnosis not present

## 2017-04-30 DIAGNOSIS — G4089 Other seizures: Secondary | ICD-10-CM | POA: Diagnosis present

## 2017-04-30 DIAGNOSIS — Z7951 Long term (current) use of inhaled steroids: Secondary | ICD-10-CM | POA: Diagnosis not present

## 2017-04-30 DIAGNOSIS — R9431 Abnormal electrocardiogram [ECG] [EKG]: Secondary | ICD-10-CM | POA: Diagnosis not present

## 2017-04-30 DIAGNOSIS — K2970 Gastritis, unspecified, without bleeding: Secondary | ICD-10-CM | POA: Diagnosis present

## 2017-04-30 DIAGNOSIS — E876 Hypokalemia: Secondary | ICD-10-CM | POA: Diagnosis present

## 2017-04-30 DIAGNOSIS — F10239 Alcohol dependence with withdrawal, unspecified: Secondary | ICD-10-CM | POA: Diagnosis present

## 2017-04-30 DIAGNOSIS — Z882 Allergy status to sulfonamides status: Secondary | ICD-10-CM | POA: Diagnosis not present

## 2017-04-30 DIAGNOSIS — F329 Major depressive disorder, single episode, unspecified: Secondary | ICD-10-CM | POA: Diagnosis present

## 2017-04-30 DIAGNOSIS — R569 Unspecified convulsions: Secondary | ICD-10-CM | POA: Diagnosis present

## 2017-04-30 DIAGNOSIS — Z87891 Personal history of nicotine dependence: Secondary | ICD-10-CM | POA: Diagnosis not present

## 2017-04-30 DIAGNOSIS — I10 Essential (primary) hypertension: Secondary | ICD-10-CM | POA: Diagnosis present

## 2017-04-30 LAB — CBC
HCT: 41.5 % (ref 36.0–46.0)
Hemoglobin: 14.6 g/dL (ref 12.0–15.0)
MCH: 30.3 pg (ref 26.0–34.0)
MCHC: 35.2 g/dL (ref 30.0–36.0)
MCV: 86.1 fL (ref 78.0–100.0)
PLATELETS: 273 10*3/uL (ref 150–400)
RBC: 4.82 MIL/uL (ref 3.87–5.11)
RDW: 13.3 % (ref 11.5–15.5)
WBC: 17.4 10*3/uL — AB (ref 4.0–10.5)

## 2017-04-30 LAB — COMPREHENSIVE METABOLIC PANEL
ALBUMIN: 4.7 g/dL (ref 3.5–5.0)
ALT: 25 U/L (ref 14–54)
AST: 65 U/L — AB (ref 15–41)
Alkaline Phosphatase: 61 U/L (ref 38–126)
Anion gap: 16 — ABNORMAL HIGH (ref 5–15)
BUN: 18 mg/dL (ref 6–20)
CHLORIDE: 91 mmol/L — AB (ref 101–111)
CO2: 20 mmol/L — AB (ref 22–32)
Calcium: 9.7 mg/dL (ref 8.9–10.3)
Creatinine, Ser: 0.95 mg/dL (ref 0.44–1.00)
GFR calc Af Amer: >60 mL/min (ref >60)
GFR calc non Af Amer: >60 mL/min (ref >60)
GLUCOSE: 123 mg/dL — AB (ref 65–99)
POTASSIUM: 2.8 mmol/L — AB (ref 3.5–5.1)
Sodium: 127 mmol/L — ABNORMAL LOW (ref 135–145)
Total Bilirubin: 1.3 mg/dL — ABNORMAL HIGH (ref 0.3–1.2)
Total Protein: 7.9 g/dL (ref 6.5–8.1)

## 2017-04-30 LAB — URINALYSIS, ROUTINE W REFLEX MICROSCOPIC
BACTERIA UA: NONE SEEN
Bilirubin Urine: NEGATIVE
GLUCOSE, UA: NEGATIVE mg/dL
KETONES UR: 20 mg/dL — AB
Leukocytes, UA: NEGATIVE
Nitrite: NEGATIVE
PROTEIN: 100 mg/dL — AB
SQUAMOUS EPITHELIAL / LPF: NONE SEEN
Specific Gravity, Urine: 1.02 (ref 1.005–1.030)
pH: 6 (ref 5.0–8.0)

## 2017-04-30 LAB — SODIUM, URINE, RANDOM: SODIUM UR: 61 mmol/L

## 2017-04-30 LAB — CBG MONITORING, ED: GLUCOSE-CAPILLARY: 133 mg/dL — AB (ref 65–99)

## 2017-04-30 LAB — RAPID URINE DRUG SCREEN, HOSP PERFORMED
Amphetamines: NOT DETECTED
Barbiturates: NOT DETECTED
Benzodiazepines: POSITIVE — AB
Cocaine: NOT DETECTED
Opiates: NOT DETECTED
Tetrahydrocannabinol: POSITIVE — AB

## 2017-04-30 LAB — CREATININE, URINE, RANDOM: CREATININE, URINE: 110.29 mg/dL

## 2017-04-30 MED ORDER — THIAMINE HCL 100 MG/ML IJ SOLN
100.0000 mg | Freq: Every day | INTRAMUSCULAR | Status: DC
  Administered 2017-05-02: 100 mg via INTRAVENOUS
  Filled 2017-04-30: qty 2

## 2017-04-30 MED ORDER — SODIUM CHLORIDE 0.9 % IV BOLUS (SEPSIS)
1000.0000 mL | Freq: Once | INTRAVENOUS | Status: AC
  Administered 2017-04-30: 1000 mL via INTRAVENOUS

## 2017-04-30 MED ORDER — LORAZEPAM 2 MG/ML IJ SOLN
1.0000 mg | Freq: Once | INTRAMUSCULAR | Status: AC
  Administered 2017-04-30: 1 mg via INTRAVENOUS
  Filled 2017-04-30: qty 1

## 2017-04-30 MED ORDER — MORPHINE SULFATE (PF) 2 MG/ML IV SOLN: 2.0000 mg | INTRAVENOUS | Status: DC | PRN

## 2017-04-30 MED ORDER — IOPAMIDOL (ISOVUE-300) INJECTION 61%
INTRAVENOUS | Status: AC
  Administered 2017-04-30: 100 mL
  Filled 2017-04-30: qty 100

## 2017-04-30 MED ORDER — ACETAMINOPHEN 650 MG RE SUPP: 650.0000 mg | Freq: Four times a day (QID) | RECTAL | Status: DC | PRN

## 2017-04-30 MED ORDER — SODIUM CHLORIDE 0.9 % IV SOLN
INTRAVENOUS | Status: AC
  Administered 2017-05-01: 100 mL/h via INTRAVENOUS

## 2017-04-30 MED ORDER — LEVETIRACETAM IN NACL 1000 MG/100ML IV SOLN
1000.0000 mg | Freq: Once | INTRAVENOUS | Status: AC
  Administered 2017-04-30: 1000 mg via INTRAVENOUS
  Filled 2017-04-30: qty 100

## 2017-04-30 MED ORDER — FOLIC ACID 5 MG/ML IJ SOLN
1.0000 mg | Freq: Every day | INTRAMUSCULAR | Status: DC
  Administered 2017-05-02: 1 mg via INTRAVENOUS
  Filled 2017-04-30 (×2): qty 0.2

## 2017-04-30 MED ORDER — LORAZEPAM 2 MG/ML IJ SOLN
2.0000 mg | INTRAMUSCULAR | Status: DC | PRN
  Administered 2017-05-01 – 2017-05-02 (×3): 2 mg via INTRAVENOUS
  Filled 2017-04-30 (×3): qty 1

## 2017-04-30 MED ORDER — SODIUM CHLORIDE 0.9 % IV SOLN: 1400.0000 mg | Freq: Once | INTRAVENOUS | Status: DC

## 2017-04-30 MED ORDER — LEVETIRACETAM IN NACL 500 MG/100ML IV SOLN
500.0000 mg | Freq: Two times a day (BID) | INTRAVENOUS | Status: DC
  Administered 2017-05-01 – 2017-05-02 (×3): 500 mg via INTRAVENOUS
  Filled 2017-04-30 (×4): qty 100

## 2017-04-30 MED ORDER — ACETAMINOPHEN 325 MG PO TABS: 650.0000 mg | ORAL_TABLET | Freq: Four times a day (QID) | ORAL | Status: DC | PRN

## 2017-04-30 MED ORDER — POTASSIUM CHLORIDE 10 MEQ/100ML IV SOLN
10.0000 meq | INTRAVENOUS | Status: AC
  Administered 2017-04-30 – 2017-05-01 (×4): 10 meq via INTRAVENOUS
  Filled 2017-04-30 (×4): qty 100

## 2017-04-30 MED ORDER — MIDAZOLAM HCL 2 MG/2ML IJ SOLN
2.0000 mg | Freq: Once | INTRAMUSCULAR | Status: AC
  Administered 2017-04-30: 2 mg via INTRAVENOUS
  Filled 2017-04-30: qty 2

## 2017-04-30 MED ORDER — POTASSIUM CHLORIDE 10 MEQ/100ML IV SOLN
10.0000 meq | Freq: Once | INTRAVENOUS | Status: AC
  Administered 2017-04-30: 10 meq via INTRAVENOUS
  Filled 2017-04-30: qty 100

## 2017-04-30 MED ORDER — MOMETASONE FURO-FORMOTEROL FUM 100-5 MCG/ACT IN AERO
2.0000 | INHALATION_SPRAY | Freq: Two times a day (BID) | RESPIRATORY_TRACT | Status: DC
  Administered 2017-05-01 – 2017-05-03 (×5): 2 via RESPIRATORY_TRACT
  Filled 2017-04-30 (×2): qty 8.8

## 2017-04-30 NOTE — ED Provider Notes (Signed)
[facility]-EMERGENCY DEPT Provider Note   CSN: [id number] Arrival date & time: 04/30/17  1359     History   Chief Complaint Chief Complaint  Patient presents with  . Altered Mental Status    HPI Courtney Miles is a 62 y.o. female with PMH/o hypertension, depression,  seizure who presents for evaluation of altered mental status and seizure activity that began this morning.  Husband reports that patient started having some nausea and vomiting that began last night.  He reports multiple episodes of nonbloody, nonbilious emesis.  He also reports multiple episodes of nonbloody diarrhea.  He states that patient had not been able to tolerate much p.o. since onset of symptoms.  Husband reports that they went to primary care doctor today for evaluation of symptoms and was given Phenergan and sent home.  Husband reports at home, patient started complaining of some vision abnormalities, started having increased lethargy and started becoming more confused.  Husband reports that patient then had abruptly 1 minute of tonic-clonic seizure activity followed by a postictal period.  He states that patient had been down on her tongue but then he put his hand in her mouth to prevent bit down on her finger.  She did have urinary incontinence.  Husband reports that patient is still not back to baseline.  He states that at baseline, P patient is alert and oriented x3 without any difficulty.  Patient is still confused and is minimally responsive to questions.  Husband denies any recent sicknesses, fevers, cough.  He states she is not currently on any seizure medication.  The history is provided by the spouse.   EM CAVEAT LEVEL 5: Due to patient's AMS   Past Medical History:  Diagnosis Date  . Arthritis   . Depression   . Gastroenteritis   . Hypertension     Patient Active Problem List   Diagnosis Date Noted  . Nausea & vomiting 04/30/2017  . Dehydration 04/30/2017  . Leukocytosis  04/30/2017  . Hypokalemia 04/30/2017  . Prolonged QT interval 04/30/2017  . Gastroenteritis   . Altered mental status 04/02/2012  . Seizure (HCC) 03/31/2012  . Abnormal EKG 03/31/2012  . 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 HTN (hypertension) 03/31/2012  . Hyponatremia 03/31/2012  . Viral illness 03/31/2012    History reviewed. No pertinent surgical history.   OB History   None      Home Medications    Prior to Admission medications   Medication Sig Start Date End Date Taking? Authorizing Provider  Cholecalciferol (VITAMIN D) 2000 UNITS tablet Take 4,000 Units by mouth daily.   Yes [provider]  DULERA 100-5 MCG/ACT AERO Take 2 puffs by mouth 2 (two) times daily. 04/20/17  Yes [provider]  escitalopram (LEXAPRO) 10 MG tablet Take 10 mg by mouth daily after breakfast. 02/27/17  Yes [provider]  losartan (COZAAR) 50 MG tablet Take 50 mg by mouth daily. 02/26/17  Yes [provider]  metoprolol succinate (TOPROL-XL) 50 MG 24 hr tablet Take 50 mg by mouth every evening. Take with or immediately following a meal.   Yes [provider]  pantoprazole (PROTONIX) 40 MG tablet Take 40 mg by mouth daily. 04/30/17  Yes [provider]  promethazine (PHENERGAN) 25 MG tablet Take 25 mg by mouth every 4 (four) hours as needed for nausea/vomiting. 04/30/17  Yes [provider]  sucralfate (CARAFATE) 1 g tablet Take 1 g by mouth 2 (two) times daily. 04/30/17  Yes [provider]  vitamin B-12 (CYANOCOBALAMIN) 500 MCG tablet Take 500 mcg by mouth daily.   Yes [provider]    Family History Family History  Problem Relation Age of Onset  . Angina Mother   . Heart attack Mother   . Diabetic kidney disease Sister   . Seizures Sister     Social History Social History   Tobacco Use  . Smoking status: Former Games developermoker  . Smokeless tobacco: Never Used  Substance Use Topics  . Alcohol use: Yes    Alcohol/week: 0.5  oz    Types: 1 Standard drinks or equivalent per week  . Drug use: Yes    Frequency: 7.0 times per week    Types: Marijuana     Allergies   Sulfa antibiotics   Review of Systems Review of Systems  Unable to perform ROS: Mental status change     Physical Exam Updated Vital Signs BP (!) 150/98   Pulse 85   Temp 98.8 F (37.1 C) (Rectal)   Resp (!) 30   SpO2 98%   Physical Exam  Constitutional: She appears well-developed and well-nourished.  Appears uncomfortable, constantly trying to sit up and get out bed  HENT:  Head: Normocephalic and atraumatic.  Mouth/Throat: Mucous membranes are dry.  No tenderness to palpation of skull. No deformities or crepitus noted. No open wounds, abrasions or lacerations.   Eyes: Pupils are equal, round, and reactive to light. Conjunctivae, EOM and lids are normal.  EOMs spontaneously normal but unable to direct patient to follow finger  Neck: Normal range of motion. Neck supple. No neck rigidity.  Cardiovascular: Normal rate, regular rhythm, normal heart sounds and normal pulses. Exam reveals no gallop and no friction rub.  No murmur heard. Pulmonary/Chest: Effort normal and breath sounds normal.  No evidence of respiratory distress.   Abdominal: Soft. Normal appearance. There is tenderness in the epigastric area. There is no rigidity and no guarding.  Abdomen is soft, non-distended. Patient does exhibit epigastric tenderness.   Musculoskeletal: Normal range of motion.  Neurological: She is alert.  Patient is alert, attempting to climb out of bed Unable to redirect patient to follow commands Unable to assess neuro status.   Skin: Skin is warm and dry. Capillary refill takes less than 2 seconds.  Psychiatric: She has a normal mood and affect. Her speech is normal.  Nursing note and vitals reviewed.    ED Treatments / Results  Labs (all labs ordered are listed, but only abnormal results are displayed) Labs Reviewed  CBC - Abnormal;  Notable for the following components:      Result Value   WBC 17.4 (*)    All other components within normal limits  COMPREHENSIVE METABOLIC PANEL - Abnormal; Notable for the following components:   Sodium 127 (*)    Potassium 2.8 (*)    Chloride 91 (*)    CO2 20 (*)    Glucose, Bld 123 (*)    AST 65 (*)    Total Bilirubin 1.3 (*)    Anion gap 16 (*)    All other components within normal limits  URINALYSIS, ROUTINE W REFLEX MICROSCOPIC - Abnormal; Notable for the following components:   Hgb urine dipstick LARGE (*)    Ketones, ur 20 (*)    Protein, ur 100 (*)    All other components within normal limits  RAPID URINE DRUG SCREEN, HOSP PERFORMED - Abnormal; Notable for the following components:   Benzodiazepines POSITIVE (*)  Tetrahydrocannabinol POSITIVE (*)    All other components within normal limits  CBG MONITORING, ED - Abnormal; Notable for the following components:   Glucose-Capillary 133 (*)    All other components within normal limits  GASTROINTESTINAL PANEL BY PCR, STOOL (REPLACES STOOL CULTURE)  MAGNESIUM  PHOSPHORUS  CK  SODIUM, URINE, RANDOM  CREATININE, URINE, RANDOM  OSMOLALITY, URINE  TROPONIN I  TROPONIN I  TROPONIN I  TROPONIN I    EKG None  Radiology Ct Head Wo Contrast  Result Date: 04/30/2017 CLINICAL DATA:  Seizure.  Acute mental status change. EXAM: CT HEAD WITHOUT CONTRAST TECHNIQUE: Contiguous axial images were obtained from the base of the skull through the vertex without intravenous contrast. COMPARISON:  March 31, 2012 FINDINGS: Brain: No evidence of acute infarction, hemorrhage, hydrocephalus, extra-axial collection or mass lesion/mass effect. Vascular: No hyperdense vessel or unexpected calcification. Skull: Normal. Negative for fracture or focal lesion. Sinuses/Orbits: No acute finding. Other: None. IMPRESSION: No cause for seizure identified.  No acute abnormality. Electronically Signed   By: Gerome Sam III M.D   On: 04/30/2017  17:25   Ct Abdomen Pelvis W Contrast  Result Date: 04/30/2017 CLINICAL DATA:  Abdominal pain with nausea and vomiting EXAM: CT ABDOMEN AND PELVIS WITH CONTRAST TECHNIQUE: Multidetector CT imaging of the abdomen and pelvis was performed using the standard protocol following bolus administration of intravenous contrast. CONTRAST:  ISOVUE-300 IOPAMIDOL (ISOVUE-300) INJECTION 61% COMPARISON:  04/02/2012 FINDINGS: Lower chest: Lung bases are free of acute infiltrate or sizable effusion. Vague ground-glass density is noted in the left lower lobe in the subpleural location measuring approximately 6 mm in greatest dimension. This is likely postinflammatory in nature. Hepatobiliary: Mild fatty infiltration of the liver is noted. Gallbladder is within normal limits. Pancreas: Unremarkable. No pancreatic ductal dilatation or surrounding inflammatory changes. Spleen: Normal in size without focal abnormality. Adrenals/Urinary Tract: Adrenal glands are within normal limits. Kidneys are well visualized bilaterally without obstructive change. Nonobstructing stone is noted in the midportion of the left kidney measuring 6 mm. Bladder is partially decompressed. Stomach/Bowel: The appendix is within normal limits. Transient small bowel intussusception is again noted similar to that seen in 2014. No obstructive changes are seen. No inflammatory changes are noted. Vascular/Lymphatic: Aortic atherosclerosis. No enlarged abdominal or pelvic lymph nodes. Reproductive: Fibroid uterus is noted.  No adnexal mass is seen. Other: No abdominal wall hernia or abnormality. No abdominopelvic ascites. Musculoskeletal: Degenerative changes of the lumbar spine are noted. IMPRESSION: Nonobstructing left renal stone. Transient small bowel intussusception in the left mid abdomen without obstructive change. 6 mm ground-glass density in the left lower lobe likely postinflammatory in nature. Initial follow-up with CT at 6-12 months is recommended  to confirm persistence. If persistent, repeat CT is recommended every 2 years until 5 years of stability has been established. This recommendation follows the consensus statement: Guidelines for Management of Incidental Pulmonary Nodules Detected on CT Images: From the Fleischner Society 2017; Radiology 2017; 284:228-243. Stable fibroid uterus. Electronically Signed   By: Alcide Clever M.D.   On: 04/30/2017 17:30    Procedures Procedures (including critical care time)  Medications Ordered in ED Medications  potassium chloride 10 mEq in 100 mL IVPB (has no administration in time range)  sodium chloride 0.9 % bolus 1,000 mL (0 mLs Intravenous Stopped 04/30/17 1646)  LORazepam (ATIVAN) injection 1 mg (1 mg Intravenous Given 04/30/17 1615)  potassium chloride 10 mEq in 100 mL IVPB (0 mEq Intravenous Stopped 04/30/17 1745)  iopamidol (ISOVUE-300) 61 %  injection (100 mLs  Contrast Given 04/30/17 1653)  midazolam (VERSED) injection 2 mg (2 mg Intravenous Given 04/30/17 1738)  levETIRAcetam (KEPPRA) IVPB 1000 mg/100 mL premix (0 mg Intravenous Stopped 04/30/17 1914)     Initial Impression / Assessment and Plan / ED Course  I have reviewed the triage vital signs and the nursing notes.  Pertinent labs & imaging results that were available during my care of the patient were reviewed by me and considered in my medical decision making (see chart for details).  Clinical Course as of Apr 30 2028  Sat Apr 30, 2017  1619 Comprehensive metabolic panel(!) [LL]    Clinical Course User Index [LL] Maxwell Caul, PA-C    62 y.o. F with PMH/o hypertension who presents for evaluation of seizures.  Has been reports nausea/vomiting since last night.  Patient has had one prior seizure before in 2014.  Is not currently on any anti-epileptic medication.  Husband reports 1 minute of tonic-clonic seizure activity followed by postictal.  On ED arrival, patient is not back to baseline. Patient is afebrile. Vital signs  reviewed and stable.  Patient is alert but unable to follow commands or answer questions appropriately.  Unable to assess for any neurological deficit secondary to altered mental status.  Consider infectious etiology versus intracranial abnormality versus electrolyte imbalance given history of nausea/vomiting and diarrhea.  Patient was given Versed prior to ED arrival.  Will give additional Ativan to help with agitation.  Plan to check basic labs, give fluids, UA.  Since patient has not had seizures in the last 5 years and has had no further CT head imaging,   We will plan for CT head for evaluation.  CMP shows sodium of 127.  Potassium is 2.8. Plan to replete here in the ED.  Bicarb is 20. Chloride is 91 BUN and creatinine are within normal limits.  AST is slightly elevated at 65.  Total bili slightly elevated at 1.3.  Patient has an anion gap of 16, likely secondary to metabolic abnormalities from dehydration.  CBC shows leukocytosis of 17.4.  No evidence of anemia.  Urine shows large hemoglobin, no evidence of leukocytes, nitrates.  Urine drug screen shows positive benzodiazepine, marijuana. Husband states that patient is not on any benzos at home.   Discussed patient with Dr. Wilford Corner (neuro). Recommends giving a loading dose of Keppra (1g) with plans for further 500 mg Keppra BID. Does not recommend LP at this time given history/physical exam, lack of fever, vitals, and presentation. Agrees with plans for admission but would like patient admitted to [facility] for EEG.   Discussed patient with Dr. Adela Glimpse (hospitalist). Will admit.    Final Clinical Impressions(s) / ED Diagnoses   Final diagnoses:  Altered mental status, unspecified altered mental status type  Seizure [facility])    ED Discharge Orders    None       Maxwell Caul, PA-C 04/30/17 2030

## 2017-04-30 NOTE — ED Notes (Signed)
Patient transported to CT 

## 2017-04-30 NOTE — ED Notes (Signed)
ED TO INPATIENT HANDOFF REPORT  Name/Age/Gender Courtney Miles 62 y.o. female  Code Status Code Status History    Date Active Date Inactive Code Status Order ID Comments User Context   03/31/2012 1354 04/04/2012 1755 Full Code [id number]  Nita Sells, MD Inpatient      Home/SNF/Other Home  Chief Complaint seizure, alt mental status  Level of Care/Admitting Diagnosis ED Disposition    ED Disposition Condition [place]: [facility] [[id number]]  Level of Care: Stepdown [14]  Diagnosis: Post-ictal state [facility]) [[id number]]  Admitting Physician: Toy Baker [[id number]]  Attending Physician: Toy Baker [[id number]]  Estimated length of stay: 3 - 4 days  Certification:: I certify this patient will need inpatient services for at least 2 midnights  PT Class (Do Not Modify): Inpatient [101]  PT Acc Code (Do Not Modify): Private [1]       Medical History Past Medical History:  Diagnosis Date  . Arthritis   . Depression   . Gastroenteritis   . Hypertension     Allergies Allergies  Allergen Reactions  . Sulfa Antibiotics Other (See Comments)    Turned bright red     IV Location/Drains/Wounds Patient Lines/Drains/Airways Status   Active Line/Drains/Airways    None          Labs/Imaging Results for orders placed or performed during the hospital encounter of 04/30/17 (from the past 48 hour(s))  CBC     Status: Abnormal   Collection Time: 04/30/17  2:28 PM  Result Value Ref Range   WBC 17.4 (H) 4.0 - 10.5 K/uL   RBC 4.82 3.87 - 5.11 MIL/uL   Hemoglobin 14.6 12.0 - 15.0 g/dL   HCT 41.5 36.0 - 46.0 %   MCV 86.1 78.0 - 100.0 fL   MCH 30.3 26.0 - 34.0 pg   MCHC 35.2 30.0 - 36.0 g/dL   RDW 13.3 11.5 - 15.5 %   Platelets 273 150 - 400 K/uL    Comment: Performed at [facility], [place] [address] [place], [place] [address]  Comprehensive metabolic panel     Status: Abnormal   Collection Time: 04/30/17   2:28 PM  Result Value Ref Range   Sodium 127 (L) 135 - 145 mmol/L   Potassium 2.8 (L) 3.5 - 5.1 mmol/L   Chloride 91 (L) 101 - 111 mmol/L   CO2 20 (L) 22 - 32 mmol/L   Glucose, Bld 123 (H) 65 - 99 mg/dL   BUN 18 6 - 20 mg/dL   Creatinine, Ser 0.95 0.44 - 1.00 mg/dL   Calcium 9.7 8.9 - 10.3 mg/dL   Total Protein 7.9 6.5 - 8.1 g/dL   Albumin 4.7 3.5 - 5.0 g/dL   AST 65 (H) 15 - 41 U/L   ALT 25 14 - 54 U/L   Alkaline Phosphatase 61 38 - 126 U/L   Total Bilirubin 1.3 (H) 0.3 - 1.2 mg/dL   GFR calc non Af Amer >60 >60 mL/min   GFR calc Af Amer >60 >60 mL/min    Comment: (NOTE) The eGFR has been calculated using the CKD EPI equation. This calculation has not been validated in all clinical situations. eGFR's persistently <60 mL/min signify possible Chronic Kidney Disease.    Anion gap 16 (H) 5 - 15    Comment: Performed at [facility], [place] [address] [place], [place] [address]  CBG monitoring, ED     Status: Abnormal   Collection Time: 04/30/17  2:40 PM  Result Value Ref Range   Glucose-Capillary 133 (H) 65 - 99 mg/dL  Urinalysis, Routine w reflex microscopic     Status: Abnormal   Collection Time: 04/30/17  4:37 PM  Result Value Ref Range   Color, Urine YELLOW YELLOW   APPearance CLEAR CLEAR   Specific Gravity, Urine 1.020 1.005 - 1.030   pH 6.0 5.0 - 8.0   Glucose, UA NEGATIVE NEGATIVE mg/dL   Hgb urine dipstick LARGE (A) NEGATIVE   Bilirubin Urine NEGATIVE NEGATIVE   Ketones, ur 20 (A) NEGATIVE mg/dL   Protein, ur 100 (A) NEGATIVE mg/dL   Nitrite NEGATIVE NEGATIVE   Leukocytes, UA NEGATIVE NEGATIVE   RBC / HPF 0-5 0 - 5 RBC/hpf   WBC, UA 0-5 0 - 5 WBC/hpf   Bacteria, UA NONE SEEN NONE SEEN   Squamous Epithelial / LPF NONE SEEN NONE SEEN   Granular Casts, UA PRESENT     Comment: Performed at [facility], [place] [address] [place], [place] [address]  Urine rapid drug screen (hosp performed)     Status: Abnormal   Collection Time:  04/30/17  4:37 PM  Result Value Ref Range   Opiates NONE DETECTED NONE DETECTED   Cocaine NONE DETECTED NONE DETECTED   Benzodiazepines POSITIVE (A) NONE DETECTED   Amphetamines NONE DETECTED NONE DETECTED   Tetrahydrocannabinol POSITIVE (A) NONE DETECTED   Barbiturates NONE DETECTED NONE DETECTED    Comment: (NOTE) DRUG SCREEN FOR MEDICAL PURPOSES ONLY.  IF CONFIRMATION IS NEEDED FOR ANY PURPOSE, NOTIFY LAB WITHIN 5 DAYS. LOWEST DETECTABLE LIMITS FOR URINE DRUG SCREEN Drug Class                     Cutoff (ng/mL) Amphetamine and metabolites    1000 Barbiturate and metabolites    200 Benzodiazepine                 024 Tricyclics and metabolites     300 Opiates and metabolites        300 Cocaine and metabolites        300 THC                            50 Performed at [facility], [place] [address], [place], [place] [address]   Sodium, urine, random     Status: None   Collection Time: 04/30/17  7:49 PM  Result Value Ref Range   Sodium, Ur 61 mmol/L    Comment: Performed at [facility], [place] [address] [place], [place] [address]  Creatinine, urine, random     Status: None   Collection Time: 04/30/17  7:49 PM  Result Value Ref Range   Creatinine, Urine 110.29 mg/dL    Comment: Performed at [facility], [place] [address], [place], [place] [address]   Ct Head Wo Contrast  Result Date: 04/30/2017 CLINICAL DATA:  Seizure.  Acute mental status change. EXAM: CT HEAD WITHOUT CONTRAST TECHNIQUE: Contiguous axial images were obtained from the base of the skull through the vertex without intravenous contrast. COMPARISON:  March 31, 2012 FINDINGS: Brain: No evidence of acute infarction, hemorrhage, hydrocephalus, extra-axial collection or mass lesion/mass effect. Vascular: No hyperdense vessel or unexpected calcification. Skull: Normal. Negative for fracture or focal lesion. Sinuses/Orbits: No acute finding. Other: None. IMPRESSION: No  cause for seizure identified.  No acute abnormality. Electronically Signed   By: Dorise Bullion  III M.D   On: 04/30/2017 17:25   Ct Abdomen Pelvis W Contrast  Result Date: 04/30/2017 CLINICAL DATA:  Abdominal pain with nausea and vomiting EXAM: CT ABDOMEN AND PELVIS WITH CONTRAST TECHNIQUE: Multidetector CT imaging of the abdomen and pelvis was performed using the standard protocol following bolus administration of intravenous contrast. CONTRAST:  168m ISOVUE-300 IOPAMIDOL (ISOVUE-300) INJECTION 61% COMPARISON:  04/02/2012 FINDINGS: Lower chest: Lung bases are free of acute infiltrate or sizable effusion. Vague ground-glass density is noted in the left lower lobe in the subpleural location measuring approximately 6 mm in greatest dimension. This is likely postinflammatory in nature. Hepatobiliary: Mild fatty infiltration of the liver is noted. Gallbladder is within normal limits. Pancreas: Unremarkable. No pancreatic ductal dilatation or surrounding inflammatory changes. Spleen: Normal in size without focal abnormality. Adrenals/Urinary Tract: Adrenal glands are within normal limits. Kidneys are well visualized bilaterally without obstructive change. Nonobstructing stone is noted in the midportion of the left kidney measuring 6 mm. Bladder is partially decompressed. Stomach/Bowel: The appendix is within normal limits. Transient small bowel intussusception is again noted similar to that seen in 2014. No obstructive changes are seen. No inflammatory changes are noted. Vascular/Lymphatic: Aortic atherosclerosis. No enlarged abdominal or pelvic lymph nodes. Reproductive: Fibroid uterus is noted.  No adnexal mass is seen. Other: No abdominal wall hernia or abnormality. No abdominopelvic ascites. Musculoskeletal: Degenerative changes of the lumbar spine are noted. IMPRESSION: Nonobstructing left renal stone. Transient small bowel intussusception in the left mid abdomen without obstructive change. 6 mm ground-glass  density in the left lower lobe likely postinflammatory in nature. Initial follow-up with CT at 6-12 months is recommended to confirm persistence. If persistent, repeat CT is recommended every 2 years until 5 years of stability has been established. This recommendation follows the consensus statement: Guidelines for Management of Incidental Pulmonary Nodules Detected on CT Images: From the Fleischner Society 2017; Radiology 2017; 284:228-243. Stable fibroid uterus. Electronically Signed   By: MInez CatalinaM.D.   On: 04/30/2017 17:30    Pending Labs Unresulted Labs (From admission, onward)   Start     Ordered   04/30/17 28786 Basic metabolic panel  Once,   R     04/30/17 2100   04/30/17 2023  Troponin I (q 6hr x 3)  Now then every 6 hours,   R     04/30/17 2022   04/30/17 2023  Troponin I  Add-on,   R     04/30/17 2022   04/30/17 1949  Magnesium  Add-on,   R     04/30/17 1948   04/30/17 1949  Phosphorus  Add-on,   R     04/30/17 1948   04/30/17 1949  CK  Add-on,   R     04/30/17 1948   04/30/17 1949  Osmolality, urine  Once,   R     04/30/17 1948   04/30/17 1948  Gastrointestinal Panel by PCR , Stool  (Gastrointestinal Panel by PCR, Stool)  Once,   R     04/30/17 1947      Vitals/Pain Today's Vitals   04/30/17 1420 04/30/17 1643 04/30/17 1723 04/30/17 1949  BP:  (!) 160/83  (!) 150/98  Pulse:  97  85  Resp:  (!) 22  (!) 30  Temp:   98.8 F (37.1 C)   TempSrc:   Rectal   SpO2:  98%  98%  PainSc: Asleep       Isolation Precautions Enteric precautions (UV disinfection)  Medications Medications  potassium chloride 10 mEq in 100 mL IVPB (10 mEq Intravenous New Bag/Given 04/30/17 2041)  sodium chloride 0.9 % bolus 1,000 mL (0 mLs Intravenous Stopped 04/30/17 1646)  LORazepam (ATIVAN) injection 1 mg (1 mg Intravenous Given 04/30/17 1615)  potassium chloride 10 mEq in 100 mL IVPB (0 mEq Intravenous Stopped 04/30/17 1745)  iopamidol (ISOVUE-300) 61 % injection (100 mLs  Contrast Given  04/30/17 1653)  midazolam (VERSED) injection 2 mg (2 mg Intravenous Given 04/30/17 1738)  levETIRAcetam (KEPPRA) IVPB 1000 mg/100 mL premix (0 mg Intravenous Stopped 04/30/17 1914)    Mobility walks

## 2017-04-30 NOTE — Consult Note (Signed)
Neurology Consultation Reason for Consult: Altered Mental Status following seizure.  Referring Physician: Adela Glimpseoutova, A  CC: Seizure  History is obtained from:patient  HPI: Courtney Miles is a 62 y.o. female with a history of a single previous seizure who presents with AMS/seizure.  Her husband states that she began vomiting profusely yesterday(Friday morning) and has not been tolerating p.o. since that time.  Today, they went to the doctor's office where she was given a shot of Phenergan.  The majority of her complaints began following this.  She initially complained of not being able to see, then became confused.  About an hour after the symptoms started, she had a seizure lasting 2-3 minutes consisting of bilateral convulsive activity.  Following this, she has been persistently confused was brought to the emergency department where she received Ativan and Versed as well as Keppra load.  She continues to be quite altered  She had a prolonged return to baseline after her previous seizure, but even relatively soon after the seizure she was confused but interactive, much more than she is currently.  Of note, she has 2 one ounce pours of tequila (husband is certain of the amount because he makes it for her) each night.  Her last drink was Thursday.     ROS: Unable to obtain due to altered mental status.   Past Medical History:  Diagnosis Date  . Arthritis   . Depression   . Gastroenteritis   . Hypertension      Family History  Problem Relation Age of Onset  . Angina Mother   . Heart attack Mother   . Diabetic kidney disease Sister   . Seizures Sister      Social History:  reports that she has quit smoking. She has never used smokeless tobacco. She reports that she drinks about 0.5 oz of alcohol per week. She reports that she has current or past drug history. Drug: Marijuana. Frequency: 7.00 times per week.   Exam: Current vital signs: BP 114/79   Pulse 89   Temp 98.8 F (37.1  C) (Rectal)   Resp 20   SpO2 99%  Vital signs in last 24 hours: Temp:  [98.2 F (36.8 C)-98.8 F (37.1 C)] 98.8 F (37.1 C) (03/23 1723) Pulse Rate:  [79-97] 89 (03/23 2100) Resp:  [20-30] 20 (03/23 2100) BP: (114-160)/(79-98) 114/79 (03/23 2100) SpO2:  [96 %-99 %] 99 % (03/23 2100)   Physical Exam  Constitutional: Appears well-developed and well-nourished.  Psych: She is irritable, and resistant to participating. Eyes: No scleral injection HENT: No OP obstrucion Head: Normocephalic.  Cardiovascular: Normal rate and regular rhythm.  Respiratory: Effort normal, non-labored breathing GI: Soft.  No distension. There is no tenderness.  Skin: WDI  Neuro: Mental Status: Patient is lethargic, but with repeated stimulation I can arouse her and she does follow commands with her right hand (wiggle thumb) but does not with any other extremity. Cranial Nerves: II: She fixates and tracks, keeps eyes tightly closed and therefore difficult to assess blink to threat. pupils are equal, round, and reactive to light.   III,IV, VI: Tracks across midline in both directions  V: Facial sensation is symmetric to temperature VII: Facial movement is symmetric.  VIII: hearing is intact to voice Motor: She moves all extremities voluntarily, but does not follow commands and anything except the right upper extremity Sensory: She responds to noxious stimulation in all 4 extremities Cerebellar: She does not perform  I have reviewed labs in epic and the  results pertinent to this consultation are: CMP-sodium 127, potassium 2.8  I have reviewed the images obtained: CT head-unremarkable  Impression: 62 yo F with progressive vision complaints leading to persistent encephalopathy following seizure earlier today. I suspect that some of her sedation at this point is medication effect(phenergan+benzos) but would like to rule out ongoing seizure activity. It is possible that she may have a mild predisposition  and her GI illness has not prompted recurrent seizures.   Factors possibly contributing to lower seizure threshold include GI illness, hyponatremia(if she was high normal recently), alcohol withdrawel, phenergan use.   Recommendations: 1) STAT EEG 2) Continue keppra 3) MRI brain w/wo contrast 4) Check Mg 5) will follow.    Ritta Slot, MD [facility] [phone]  If 7pm- 7am, please page neurology on call as listed in AMION.

## 2017-04-30 NOTE — H&P (Addendum)
Courtney Miles:[id number] DOB: 1955-07-08 DOA: 04/30/2017    PCP: [facility] Specialists:   CARDS: unsure who      Patient arrived to ER on 04/30/17 at 1359  Patient coming from:    home Lives  With family    Chief Complaint:  Chief Complaint  Patient presents with  . Altered Mental Status    HPI: Courtney Miles is a 62 y.o. female with medical history significant of hypertension and depression prior episode of seizure, heart murmur    Presented with 2days  nausea and vomiting comes from home she was not feeling not well this afternoon after prolonged episode of nausea and vomiting as well as nonbloody diarrhea.  She could not hardly eat anything all day husband took her to her primary care provider and she was given Phenergan to try to help with her symptoms.  While at home patient gradually became more sick confused and lethargic.  Patient had a witnessed seizure with tongue biting.  This episode lasted for at least 1 minute described as tonic-clonic activity associated urinary incontinence husband attempted to put his finger in her mouth to prevent her from biting on her tongue.  He called EMS on their arrival they administered 5 mg of Versed.  Patient had postictal episode and was taken to emergency department Husband states that she have not had any recent fevers or chills. Of note she has remote history of similar episodes of nausea vomiting followed by seizure in 2014.  Patient never required any seizure medications thereafter.  No sick contacts. She is a Education administrator.  Husband states that she has been drinking tequila at least 2 shots a day for past 1 month noted to her be slightly tremorous for the past few days has not been able to drink any alcohol secondary to nausea and vomiting.  States she has history of gastric ulcers and used to drink wine but since development of gastric ulcers the wind was not agreeing with her so she switched to tequila family denies  history of.  Blood per rectum but did report there was small amount of blood in 1 of the vomitus today after significant retching  while in ER: Remained calm confused and not back to her baseline  Significant initial  Findings: Abnormal Labs Reviewed  CBC - Abnormal; Notable for the following components:      Result Value   WBC 17.4 (*)    All other components within normal limits  COMPREHENSIVE METABOLIC PANEL - Abnormal; Notable for the following components:   Sodium 127 (*)    Potassium 2.8 (*)    Chloride 91 (*)    CO2 20 (*)    Glucose, Bld 123 (*)    AST 65 (*)    Total Bilirubin 1.3 (*)    Anion gap 16 (*)    All other components within normal limits  URINALYSIS, ROUTINE W REFLEX MICROSCOPIC - Abnormal; Notable for the following components:   Hgb urine dipstick LARGE (*)    Ketones, ur 20 (*)    Protein, ur 100 (*)    All other components within normal limits  RAPID URINE DRUG SCREEN, HOSP PERFORMED - Abnormal; Notable for the following components:   Benzodiazepines POSITIVE (*)    Tetrahydrocannabinol POSITIVE (*)    All other components within normal limits  CBG MONITORING, ED - Abnormal; Notable for the following components:   Glucose-Capillary 133 (*)    All other components within normal limits  Cr    Up from baseline see below Lab Results  Component Value Date   CREATININE 0.95 04/30/2017   CREATININE 0.62 06/05/2012   CREATININE 0.64 04/03/2012     WBC  17.4  HG/HCT  stable,       Component Value Date/Time   HGB 14.6 04/30/2017 1428   HCT 41.5 04/30/2017 1428       UA   no evidence of UTI    CT HEAD NON acute   CT abdomen pelvis transient small bowel intussusception without evidence of obstruction  ECG:  Personally reviewed by me showing: HR : 83 Rhythm: Left anterior fascicular block Ischemic changes nonspecific changes ST depression in lateral leads unchanged from prior  QTC 505     ED Triage Vitals  Enc Vitals Group     BP  04/30/17 1415 138/82     Pulse Rate 04/30/17 1415 83     Resp 04/30/17 1415 (!) 23     Temp 04/30/17 1415 98.2 F (36.8 C)     Temp Source 04/30/17 1415 Axillary     SpO2 04/30/17 1415 96 %     Weight --      Height --      Head Circumference --      Peak Flow --      Pain Score 04/30/17 1420 Asleep     Pain Loc --      Pain Edu? --      Excl. in GC? --     ED Triage Vitals  Enc Vitals Group     BP 04/30/17 1415 138/82     Pulse Rate 04/30/17 1415 83     Resp 04/30/17 1415 (!) 23     Temp 04/30/17 1415 98.2 F (36.8 C)     Temp Source 04/30/17 1415 Axillary     SpO2 04/30/17 1415 96 %     Weight --      Height --      Head Circumference --      Peak Flow --      Pain Score 04/30/17 1420 Asleep     Pain Loc --      Pain Edu? --      Excl. in GC? --      Latest  Blood pressure (!) 160/83, pulse 97, temperature 98.8 F (37.1 C), temperature source Rectal, resp. rate (!) 22, SpO2 98 %.   Following Medications were ordered in ER: Medications  levETIRAcetam (KEPPRA) IVPB 1000 mg/100 mL premix (1,000 mg Intravenous New Bag/Given 04/30/17 1839)  sodium chloride 0.9 % bolus 1,000 mL (0 mLs Intravenous Stopped 04/30/17 1646)  LORazepam (ATIVAN) injection 1 mg (1 mg Intravenous Given 04/30/17 1615)  potassium chloride 10 mEq in 100 mL IVPB (0 mEq Intravenous Stopped 04/30/17 1745)  iopamidol (ISOVUE-300) 61 % injection (100 mLs  Contrast Given 04/30/17 1653)  midazolam (VERSED) injection 2 mg (2 mg Intravenous Given 04/30/17 1738)    ER Provider Called: Neurology   Dr.Aurora They Recommend admission to [facility] initiation of Keppra and then continue 500 twice daily Will see on arrival to [facility] was called for admission for hydration hyponatremia hypokalemia and associated seizure     Review of Systems:    Pertinent positives include: fatigue. abdominal pain, nausea, vomiting, diarrhea,  confusion  Constitutional:  No weight loss, night sweats, Fevers,  chills, weight loss  HEENT:  No headaches, Difficulty swallowing,Tooth/dental problems,Sore throat,  No sneezing, itching, ear ache, nasal congestion, post  nasal drip,  Cardio-vascular:  No chest pain, Orthopnea, PND, anasarca, dizziness, palpitations.no Bilateral lower extremity swelling  GI:  No heartburn, indigestion, change in bowel habits, loss of appetite, melena, blood in stool, hematemesis Resp:  no shortness of breath at rest. No dyspnea on exertion, No excess mucus, no productive cough, No non-productive cough, No coughing up of blood.No change in color of mucus.No wheezing. Skin:  no rash or lesions. No jaundice GU:  no dysuria, change in color of urine, no urgency or frequency. No straining to urinate.  No flank pain.  Musculoskeletal:  No joint pain or no joint swelling. No decreased range of motion. No back pain.  Psych:  No change in mood or affect. No depression or anxiety. No memory loss.  Neuro: no localizing neurological complaints, no tingling, no weakness, no double vision, no gait abnormality, no slurred speech, no  As per HPI otherwise 10 point review of systems negative.   Past Medical History: Blood pressure (!) 160/83, pulse 97, temperature 98.8 F (37.1 C), temperature source Rectal, resp. rate (!) 22, SpO2 98 %.EDMEDS Past Medical History:  Diagnosis Date  . Arthritis   . Depression   . Gastroenteritis   . Hypertension     History reviewed. No pertinent surgical history.  Social History:  Ambulatory  independently     reports that she has quit smoking. She has never used smokeless tobacco. She reports that she drinks about 0.5 oz of alcohol per week. She reports that she has current or past drug history. Drug: Marijuana. Frequency: 7.00 times per week.   Family History:   Family History  Problem Relation Age of Onset  . Angina Mother   . Heart attack Mother   . Diabetic kidney disease Sister   . Seizures Sister     Allergies: Allergies    Allergen Reactions  . Sulfa Antibiotics Other (See Comments)    Turned bright red      Prior to Admission medications   Medication Sig Start Date End Date Taking? Authorizing Provider  budesonide-formoterol (SYMBICORT) 80-4.5 MCG/ACT inhaler Inhale 2 puffs into the lungs 2 (two) times daily.    [provider]  Cholecalciferol (VITAMIN D) 2000 UNITS tablet Take 4,000 Units by mouth daily.    [provider]  DULoxetine (CYMBALTA) 60 MG capsule Take 60 mg by mouth daily.    [provider]  metoprolol succinate (TOPROL-XL) 50 MG 24 hr tablet Take 50 mg by mouth every evening. Take with or immediately following a meal.    [provider]  vitamin B-12 (CYANOCOBALAMIN) 500 MCG tablet Take 500 mcg by mouth daily.    [provider]   Physical Exam:  1. General:  in No Acute distress  acutely ill -appearing 2. Psychological: somnolent  not Oriented, in restraints 3. Head/ENT:    Dry Mucous Membranes                          Head Non traumatic, neck supple                          Poor Dentition 4. SKIN:   decreased Skin turgor,  Skin clean Dry and intact no rash 5. Heart: Regular rate and rhythm systolic Murmur, no Rub or gallop 6. Lungs:  Clear to auscultation bilaterally, no wheezes or crackles   7. Abdomen: Soft,  non-tender, Non distended  bowel sounds present 8. Lower extremities:  no clubbing, cyanosis, or edema 9. Neurologically Grossly intact, moving all 4 extremities equally   10. MSK: Normal range of motion   LABS:     Recent Labs  Lab 04/30/17 1428  WBC 17.4*  HGB 14.6  HCT 41.5  MCV 86.1  PLT 273   Basic Metabolic Panel: Recent Labs  Lab 04/30/17 1428  NA 127*  K 2.8*  CL 91*  CO2 20*  GLUCOSE 123*  BUN 18  CREATININE 0.95  CALCIUM 9.7      HbA1C: No results for input(s): HGBA1C in the last 72 hours. CBG: Recent Labs  Lab 04/30/17 1440  GLUCAP 133*      Urine analysis:    Component Value  Date/Time   COLORURINE YELLOW 04/30/2017 1637   APPEARANCEUR CLEAR 04/30/2017 1637   LABSPEC 1.020 04/30/2017 1637   PHURINE 6.0 04/30/2017 1637   GLUCOSEU NEGATIVE 04/30/2017 1637   HGBUR LARGE (A) 04/30/2017 1637   BILIRUBINUR NEGATIVE 04/30/2017 1637   KETONESUR 20 (A) 04/30/2017 1637   PROTEINUR 100 (A) 04/30/2017 1637   UROBILINOGEN 0.2 09/10/2010 0951   NITRITE NEGATIVE 04/30/2017 1637   LEUKOCYTESUR NEGATIVE 04/30/2017 1637    Cultures:    Component Value Date/Time   SDES CSF 04/01/2012 1006   SPECREQUEST NONE 04/01/2012 1006   CULT NO GROWTH 3 DAYS 04/01/2012 1006   REPTSTATUS 04/05/2012 FINAL 04/01/2012 1006     Radiological Exams on Admission: Ct Head Wo Contrast  Result Date: 04/30/2017 CLINICAL DATA:  Seizure.  Acute mental status change. EXAM: CT HEAD WITHOUT CONTRAST TECHNIQUE: Contiguous axial images were obtained from the base of the skull through the vertex without intravenous contrast. COMPARISON:  March 31, 2012 FINDINGS: Brain: No evidence of acute infarction, hemorrhage, hydrocephalus, extra-axial collection or mass lesion/mass effect. Vascular: No hyperdense vessel or unexpected calcification. Skull: Normal. Negative for fracture or focal lesion. Sinuses/Orbits: No acute finding. Other: None. IMPRESSION: No cause for seizure identified.  No acute abnormality. Electronically Signed   By: Gerome Sam III M.D   On: 04/30/2017 17:25   Ct Abdomen Pelvis W Contrast  Result Date: 04/30/2017 CLINICAL DATA:  Abdominal pain with nausea and vomiting EXAM: CT ABDOMEN AND PELVIS WITH CONTRAST TECHNIQUE: Multidetector CT imaging of the abdomen and pelvis was performed using the standard protocol following bolus administration of intravenous contrast. CONTRAST:  ISOVUE-300 IOPAMIDOL (ISOVUE-300) INJECTION 61% COMPARISON:  04/02/2012 FINDINGS: Lower chest: Lung bases are free of acute infiltrate or sizable effusion. Vague ground-glass density is noted in the left  lower lobe in the subpleural location measuring approximately 6 mm in greatest dimension. This is likely postinflammatory in nature. Hepatobiliary: Mild fatty infiltration of the liver is noted. Gallbladder is within normal limits. Pancreas: Unremarkable. No pancreatic ductal dilatation or surrounding inflammatory changes. Spleen: Normal in size without focal abnormality. Adrenals/Urinary Tract: Adrenal glands are within normal limits. Kidneys are well visualized bilaterally without obstructive change. Nonobstructing stone is noted in the midportion of the left kidney measuring 6 mm. Bladder is partially decompressed. Stomach/Bowel: The appendix is within normal limits. Transient small bowel intussusception is again noted similar to that seen in 2014. No obstructive changes are seen. No inflammatory changes are noted. Vascular/Lymphatic: Aortic atherosclerosis. No enlarged abdominal or pelvic lymph nodes. Reproductive: Fibroid uterus is noted.  No adnexal mass is seen. Other: No abdominal wall hernia or abnormality. No abdominopelvic ascites. Musculoskeletal: Degenerative changes of the lumbar spine are noted. IMPRESSION: Nonobstructing left renal stone. Transient small bowel intussusception in the left mid  abdomen without obstructive change. 6 mm ground-glass density in the left lower lobe likely postinflammatory in nature. Initial follow-up with CT at 6-12 months is recommended to confirm persistence. If persistent, repeat CT is recommended every 2 years until 5 years of stability has been established. This recommendation follows the consensus statement: Guidelines for Management of Incidental Pulmonary Nodules Detected on CT Images: From the Fleischner Society 2017; Radiology 2017; 284:228-243. Stable fibroid uterus. Electronically Signed   By: Alcide Clever M.D.   On: 04/30/2017 17:30    Chart has been reviewed    Assessment/Plan  62 y.o. female with medical history significant of hypertension and  depression prior episode of seizure Admitted for  hydration hyponatremia hypokalemia and associated seizure   Present on Admission: Seizure-appreciate neurology input broad differential including alcohol withdrawal seizures versus seizure secondary to electrolyte abnormality and dehydration.  Transferred to [facility] obtain stat EEG and MRI. Loaded with Keppra continue Keppra 500 twice daily . Nausea & vomiting - unclear etiology no recent sick contacts will obtain gastric panel, CT of abdomen showed no evidence of acute abnormality . Dehydration - rehydrate and follow . Leukocytosis in the setting of recent seizure possibly stress reaction no known history of infectious process at this point continue to monitor . Hypokalemia replace and follow will check magnesium level . Hyponatremia -rehydrate and follow serial sodium levels doubt the seizure episode was secondary to sodium of 127 will obtain urine electrolytes admit to stepdown . Abnormal EKG -unchanged from prior given for her presentation we will obtain troponin patient with unclear cause for nausea and vomiting  . Prolonged QT interval - - will monitor on tele avoid QT prolonging medications, rehydrate correct electrolytes . HTN (hypertension) allow permissive hypertension for today Possible alcohol abuse will order CIWA protocol patient has known history of cardiac murmur  Other plan as per orders.  DVT prophylaxis:  SCD    Code Status:  FULL CODE   as per patient   Family Communication:   Family at  Bedside  plan of care was discussed with   Husband   Disposition Plan:    To home once workup is complete and patient is stable                       Would benefit from PT/OT eval prior to DC  ordered                                                Consults called: nEUROLOGY  Admission status:   inpatient      Level of care      SDU      I have spent a total of on this admission  extra time was spent to discuss case  with consultants  Prospero Mahnke 05/01/2017, 12:34 AM    [facility]  Pager [phone]   after 2 AM please page floor coverage PA If 7AM-7PM, please contact the day team taking care of the patient  [email or web address]  Password [email or web address]

## 2017-04-30 NOTE — Progress Notes (Signed)
Pt admitted to unit at 2250. Disoriented X 4. No signs pain at this time. Unable to obtain VS on arrival d/t aggression; pushing NT away. EEG tech in room at this time. Will continue to monitor.

## 2017-04-30 NOTE — ED Notes (Signed)
Bed: WA21 Expected date: 04/30/17 Expected time: 2:07 PM Means of arrival: Ambulance Comments: AMS

## 2017-04-30 NOTE — ED Triage Notes (Signed)
Patient coming from home with c/o nausea and vomiting.Patient went to PCP today for nausea and vomiting and return home. Patient have witness seizure by husband upon returning. Patient given midazolam 5 mg IM per ems.

## 2017-04-30 NOTE — ED Provider Notes (Signed)
Medical screening examination/treatment/procedure(s) were conducted as a shared visit with non-physician practitioner(s) and myself.  I personally evaluated the patient during the encounter.   EKG Interpretation None     62 year old female with 2 days of nausea vomiting diarrhea presents after having a witnessed seizure by her husband.  Characterized as generalized tonic-clonic activity for about 2 minutes.  She has been postictal and was combative.  EMS had to give her 5 mg of Versed.  Workup here shows evidence of dehydration.  Head CT was negative.  Patient was sleepy but arousable and knew her husband's name as well as stated that she did not have any neck pain.  She is afebrile at this time.  Do not feel that she has meningitis.  Discussed with neurologist on-call who recommended that patient be loaded with Keppra and [facility].  Of note, patient has similar presentation to this back in 2014.   Lorre NickAllen, Franchon Ketterman, MD 04/30/17 [phone]

## 2017-04-30 NOTE — ED Notes (Signed)
Resting in bed with eyes closed call light in reach visitor at bedside no reaction to medication noted. No respiratory or acute distress noted.

## 2017-05-01 ENCOUNTER — Inpatient Hospital Stay (HOSPITAL_COMMUNITY): Payer: BLUE CROSS/BLUE SHIELD

## 2017-05-01 DIAGNOSIS — R011 Cardiac murmur, unspecified: Secondary | ICD-10-CM

## 2017-05-01 LAB — BASIC METABOLIC PANEL
Anion gap: 14 (ref 5–15)
Anion gap: 12 (ref 5–15)
Anion gap: 15 (ref 5–15)
Anion gap: 12 (ref 5–15)
BUN: 6 mg/dL (ref 6–20)
BUN: 5 mg/dL — AB (ref 6–20)
BUN: 6 mg/dL (ref 6–20)
BUN: 6 mg/dL (ref 6–20)
CALCIUM: 8.7 mg/dL — AB (ref 8.9–10.3)
CALCIUM: 8.6 mg/dL — AB (ref 8.9–10.3)
CHLORIDE: 95 mmol/L — AB (ref 101–111)
CHLORIDE: 97 mmol/L — AB (ref 101–111)
CHLORIDE: 96 mmol/L — AB (ref 101–111)
CO2: 20 mmol/L — ABNORMAL LOW (ref 22–32)
CO2: 20 mmol/L — ABNORMAL LOW (ref 22–32)
CO2: 20 mmol/L — AB (ref 22–32)
CO2: 22 mmol/L (ref 22–32)
CREATININE: 0.67 mg/dL (ref 0.44–1.00)
CREATININE: 0.7 mg/dL (ref 0.44–1.00)
Calcium: 8.7 mg/dL — ABNORMAL LOW (ref 8.9–10.3)
Calcium: 8.4 mg/dL — ABNORMAL LOW (ref 8.9–10.3)
Chloride: 95 mmol/L — ABNORMAL LOW (ref 101–111)
Creatinine, Ser: 0.75 mg/dL (ref 0.44–1.00)
Creatinine, Ser: 0.78 mg/dL (ref 0.44–1.00)
GFR calc Af Amer: >60 mL/min (ref >60)
GFR calc Af Amer: >60 mL/min (ref >60)
GFR calc Af Amer: >60 mL/min (ref >60)
GFR calc Af Amer: >60 mL/min (ref >60)
GFR calc non Af Amer: >60 mL/min (ref >60)
GFR calc non Af Amer: >60 mL/min (ref >60)
GFR calc non Af Amer: >60 mL/min (ref >60)
GLUCOSE: 106 mg/dL — AB (ref 65–99)
GLUCOSE: 100 mg/dL — AB (ref 65–99)
GLUCOSE: 113 mg/dL — AB (ref 65–99)
Glucose, Bld: 108 mg/dL — ABNORMAL HIGH (ref 65–99)
POTASSIUM: 2.8 mmol/L — AB (ref 3.5–5.1)
Potassium: 2.8 mmol/L — ABNORMAL LOW (ref 3.5–5.1)
Potassium: 2.9 mmol/L — ABNORMAL LOW (ref 3.5–5.1)
Potassium: 2.9 mmol/L — ABNORMAL LOW (ref 3.5–5.1)
SODIUM: 129 mmol/L — AB (ref 135–145)
SODIUM: 129 mmol/L — AB (ref 135–145)
Sodium: 130 mmol/L — ABNORMAL LOW (ref 135–145)
Sodium: 130 mmol/L — ABNORMAL LOW (ref 135–145)

## 2017-05-01 LAB — COMPREHENSIVE METABOLIC PANEL
ALK PHOS: 57 U/L (ref 38–126)
ALT: 27 U/L (ref 14–54)
AST: 76 U/L — AB (ref 15–41)
Albumin: 4.1 g/dL (ref 3.5–5.0)
Anion gap: 17 — ABNORMAL HIGH (ref 5–15)
BUN: 7 mg/dL (ref 6–20)
CALCIUM: 8.8 mg/dL — AB (ref 8.9–10.3)
CHLORIDE: 93 mmol/L — AB (ref 101–111)
CO2: 21 mmol/L — AB (ref 22–32)
Creatinine, Ser: 0.68 mg/dL (ref 0.44–1.00)
GFR calc Af Amer: >60 mL/min (ref >60)
GFR calc non Af Amer: >60 mL/min (ref >60)
GLUCOSE: 63 mg/dL — AB (ref 65–99)
Potassium: 3 mmol/L — ABNORMAL LOW (ref 3.5–5.1)
SODIUM: 131 mmol/L — AB (ref 135–145)
Total Bilirubin: 1.5 mg/dL — ABNORMAL HIGH (ref 0.3–1.2)
Total Protein: 7.2 g/dL (ref 6.5–8.1)

## 2017-05-01 LAB — MAGNESIUM
MAGNESIUM: 1.5 mg/dL — AB (ref 1.7–2.4)
Magnesium: 1.8 mg/dL (ref 1.7–2.4)

## 2017-05-01 LAB — CBC
HCT: 44.2 % (ref 36.0–46.0)
Hemoglobin: 15 g/dL (ref 12.0–15.0)
MCH: 30.1 pg (ref 26.0–34.0)
MCHC: 33.9 g/dL (ref 30.0–36.0)
MCV: 88.8 fL (ref 78.0–100.0)
PLATELETS: 270 10*3/uL (ref 150–400)
RBC: 4.98 MIL/uL (ref 3.87–5.11)
RDW: 14.3 % (ref 11.5–15.5)
WBC: 13.8 10*3/uL — ABNORMAL HIGH (ref 4.0–10.5)

## 2017-05-01 LAB — TSH: TSH: 2.499 u[IU]/mL (ref 0.350–4.500)

## 2017-05-01 LAB — CK: Total CK: 2037 U/L — ABNORMAL HIGH (ref 38–234)

## 2017-05-01 LAB — OSMOLALITY, URINE: Osmolality, Ur: 624 mOsm/kg (ref 300–900)

## 2017-05-01 LAB — PHOSPHORUS
Phosphorus: 3.3 mg/dL (ref 2.5–4.6)
Phosphorus: 2.2 mg/dL — ABNORMAL LOW (ref 2.5–4.6)

## 2017-05-01 MED ORDER — GADOBENATE DIMEGLUMINE 529 MG/ML IV SOLN
12.0000 mL | Freq: Once | INTRAVENOUS | Status: AC | PRN
  Administered 2017-05-01: 12 mL via INTRAVENOUS

## 2017-05-01 MED ORDER — HEPARIN SODIUM (PORCINE) 5000 UNIT/ML IJ SOLN
5000.0000 [IU] | Freq: Three times a day (TID) | INTRAMUSCULAR | Status: DC
Start: 2017-05-01 — End: 2017-05-04
  Administered 2017-05-01 – 2017-05-04 (×8): 5000 [IU] via SUBCUTANEOUS
  Filled 2017-05-01 (×7): qty 1

## 2017-05-01 MED ORDER — POTASSIUM CHLORIDE 10 MEQ/100ML IV SOLN
10.0000 meq | INTRAVENOUS | Status: AC
  Administered 2017-05-01 – 2017-05-02 (×6): 10 meq via INTRAVENOUS
  Filled 2017-05-01 (×6): qty 100

## 2017-05-01 MED ORDER — MAGNESIUM SULFATE 2 GM/50ML IV SOLN
2.0000 g | Freq: Once | INTRAVENOUS | Status: AC
  Administered 2017-05-01: 2 g via INTRAVENOUS
  Filled 2017-05-01: qty 50

## 2017-05-01 MED ORDER — SODIUM CHLORIDE 0.9 % IV SOLN
INTRAVENOUS | Status: DC
  Administered 2017-05-01: 20:00:00 via INTRAVENOUS

## 2017-05-01 MED ORDER — LOSARTAN POTASSIUM 50 MG PO TABS
25.0000 mg | ORAL_TABLET | Freq: Every day | ORAL | Status: DC
  Administered 2017-05-01 – 2017-05-04 (×4): 25 mg via ORAL
  Filled 2017-05-01 (×4): qty 1

## 2017-05-01 NOTE — Progress Notes (Signed)
Neurology Progress Note   S:// Patient seen and examined. MRI completed.   O:// Current vital signs: BP (!) 145/75 (BP Location: Right Arm)   Pulse 75   Temp 97.6 F (36.4 C) (Axillary)   Resp 19   Ht 4\' 11"  (1.499 m)   Wt 63.6 kg (140 lb 3.4 oz)   SpO2 93%   BMI 28.32 kg/m  Vital signs in last 24 hours: Temp:  [97.6 F (36.4 C)-98.8 F (37.1 C)] 97.6 F (36.4 C) (03/24 0500) Pulse Rate:  [75-97] 75 (03/24 0500) Resp:  [19-30] 19 (03/24 0500) BP: (100-160)/(75-98) 145/75 (03/24 0511) SpO2:  [93 %-99 %] 93 % (03/24 0500) Weight:  [63.6 kg (140 lb 3.4 oz)] 63.6 kg (140 lb 3.4 oz) (03/24 0100)  General: Patient was sleeping, no acute distress HEENT: Normocephalic atraumatic Cardiovascular: S1 is heard regular rhythm Abdomen: Soft nondistended nontender Extremities: Warm well perfused Neurological exam Patient was sleeping, was difficult to arouse with voice but did wake up.  She appeared a little disoriented to place and time.  She was able to say her name but was not able to name her husband was sitting at the bedside.  She did not know where she was where she was at this time. Her speech was clear.  She was able to repeat.  Poor attention concentration. Cranial nerves: Pupils equal round react light, extra ocular movements intact, visual fields full, face symmetric, shoulders are intact, tongue midline. Motor exam: She is able to raise both her arms against gravity and keep them up in the air for a few seconds before they suddenly fell down to bed.  She was at least antigravity in both her lower extremities.  No focal weakness observed- motor exam likely related to inattention Sensory: Intact to noxious stim in all 4 Cerebellar: Unable to perform as she does not fully cooperate with it. Gait testing was deferred Medications  Current Facility-Administered Medications:  .  acetaminophen (TYLENOL) tablet 650 mg, 650 mg, Oral, Q6H PRN **OR** acetaminophen (TYLENOL)  suppository 650 mg, 650 mg, Rectal, Q6H PRN, Doutova, Anastassia, MD .  folic acid injection 1 mg, 1 mg, Intravenous, Daily, Doutova, Anastassia, MD .  levETIRAcetam (KEPPRA) IVPB 500 mg/100 mL premix, 500 mg, Intravenous, Q12H, Doutova, Anastassia, MD .  LORazepam (ATIVAN) injection 2-3 mg, 2-3 mg, Intravenous, Q1H PRN, Adela Glimpseoutova, Anastassia, MD, 2 mg at 05/01/17 0527 .  mometasone-formoterol (DULERA) 100-5 MCG/ACT inhaler 2 puff, 2 puff, Inhalation, BID, Doutova, Anastassia, MD .  morphine 2 MG/ML injection 2 mg, 2 mg, Intravenous, Q4H PRN, Doutova, Anastassia, MD .  thiamine (B-1) injection 100 mg, 100 mg, Intravenous, Daily, Doutova, Anastassia, MD   Labs CBC    Component Value Date/Time   WBC 17.4 (H) 04/30/2017 1428   RBC 4.82 04/30/2017 1428   HGB 14.6 04/30/2017 1428   HCT 41.5 04/30/2017 1428   PLT 273 04/30/2017 1428   MCV 86.1 04/30/2017 1428   MCH 30.3 04/30/2017 1428   MCHC 35.2 04/30/2017 1428   RDW 13.3 04/30/2017 1428   LYMPHSABS 1.8 06/05/2012 1255   MONOABS 0.4 06/05/2012 1255   EOSABS 0.0 06/05/2012 1255   BASOSABS 0.0 06/05/2012 1255    CMP     Component Value Date/Time   NA 127 (L) 04/30/2017 1428   K 2.8 (L) 04/30/2017 1428   CL 91 (L) 04/30/2017 1428   CO2 20 (L) 04/30/2017 1428   GLUCOSE 123 (H) 04/30/2017 1428   BUN 18 04/30/2017 1428   CREATININE  0.95 04/30/2017 1428   CALCIUM 9.7 04/30/2017 1428   PROT 7.9 04/30/2017 1428   ALBUMIN 4.7 04/30/2017 1428   AST 65 (H) 04/30/2017 1428   ALT 25 04/30/2017 1428   ALKPHOS 61 04/30/2017 1428   BILITOT 1.3 (H) 04/30/2017 1428   GFRNONAA >60 04/30/2017 1428   GFRAA >60 04/30/2017 1428  Magnesium level 1.5  Imaging I have reviewed images in epic and the results pertinent to this consultation are: CT head-no acute changes MRI head-no acute changes EEG-generalized slowing with focal left occipital slowing.  Prior EEG was normal a few years ago with similar presentation.  Assessment:  62 year old woman  with past medical history of depression, hypertension, and a history of one seizure many years ago, not on antiepileptics, presented to the outside emergency room after having had a seizure that lasted around 1 minute. She remained prolonged postictal.  Prior to her presentation with the seizure, she had been complaining of persistent GI upset and illness.  Her prior presentation many years ago with the seizure was also preceded by chest symptoms making it possible that she has a susceptibility for getting seizures and GI illness provoked the seizure. She also was found to have hyponatremia.  Other differentials considered a GI illness, alcohol withdrawal and medication use for the prolonged encephalopathy. She continues to be mildly encephalopathic but is much improved from her prior documented exams. At this point, I think she will just need some time to improve to her normal baseline.  Impression: Seizure  Recommendations: Given the multiplicity of events and this being the second seizure, would agree with AEDs treatment.  Dr. Amada Jupiter had recommended her started on Keppra.  I would recommend continuing Keppra 500 mg twice daily.  She has hypomagnesemia with magnesium 1.5 which could also have lowered the seizure threshold.  Replete magnesium (ordered in EMR). Maintain normal Mg and Phos.  Correct hyponatremia as you are.  Management of GI illness per primary team as you are.  Maintain seizure precautions.  IV thiamine and and watch for alcohol withdrawal.  -- Per [facility] statutes, patients with seizures are not allowed to drive until they have been seizure-free for six months.  Use caution when using heavy equipment or power tools. Avoid working on ladders or at heights. Take showers instead of baths. Ensure the water temperature is not too high on the home water heater. Do not go swimming alone. Do not lock yourself in a room alone (i.e. bathroom). When caring for infants  or small children, sit down when holding, feeding, or changing them to minimize risk of injury to the child in the event you have a seizure. Maintain good sleep hygiene. Avoid alcohol.  If patient has another seizure, call 911 and bring them back to the ED if: A. The seizure lasts longer than 5 minutes.  B. The patient doesn't wake shortly after the seizure or has new problems such as difficulty seeing, speaking or moving following the seizure C. The patient was injured during the seizure D. The patient has a temperature over 102 F (39C) E. The patient vomited during the seizure and now is having trouble breathing  -- Milon Dikes, MD [facility] Pager: [phone] If 7pm to 7am, please call on call as listed on AMION.

## 2017-05-01 NOTE — Procedures (Addendum)
History: 62 yo with altered mental status after seizure  Sedation: Received Ativan + Versed several hours prior to EEG  Technique: This is a 21 channel routine scalp EEG performed at the bedside with bipolar and monopolar montages arranged in accordance to the international 10/20 system of electrode placement. One channel was dedicated to EKG recording.    Background: During the very brief periods of waking, the background consists predominantly of intermixed alpha and beta activities. There is a poorly sustained posterior dominant rhythm of 10 - 11 hz. Sleep is recorded with symmetric appearing structures.   In addition there is intermixed theta and delta activity which is seen left occipital region.  Photic stimulation: Physiologic driving is not performed  EEG Abnormalities: 1) left occipital slow activity  Clinical Interpretation: This EEG is consistent with a focal area of cerebral dysfunction in the left occipital region.  This is nonspecific can be associated with structural lesions, lesions of infectious or inflammatory nature, postictal state.   There was no seizure or definite seizure predisposition recorded on this study. Please note that a normal EEG does not preclude the possibility of epilepsy.   Ritta SlotMcNeill Coran Dipaola, MD [facility] [phone]  If 7pm- 7am, please page neurology on call as listed in AMION.

## 2017-05-01 NOTE — Progress Notes (Signed)
PROGRESS NOTE    Courtney Miles  ZOX:[id number] DOB: 1955-07-01 DOA: 04/30/2017 PCP: System, Provider Not In   Brief Narrative:  62 y.o. WF PMHx Depression, Previous Seizure (per patient also secondary to illness), Polysubstance abuse, EtOH abuse, HTN, Heart murmur    Presented with 2days  nausea and vomiting comes from home she was not feeling not well this afternoon after prolonged episode of nausea and vomiting as well as nonbloody diarrhea.  She could not hardly eat anything all day husband took her to her primary care provider and she was given Phenergan to try to help with her symptoms.  While at home patient gradually became more sick confused and lethargic.  Patient had a witnessed seizure with tongue biting.  This episode lasted for at least 1 minute described as tonic-clonic activity associated urinary incontinence husband attempted to put his finger in her mouth to prevent her from biting on her tongue.  He called EMS on their arrival they administered 5 mg of Versed.  Patient had postictal episode and was taken to emergency department Husband states that she have not had any recent fevers or chills. Of note she has remote history of similar episodes of nausea vomiting followed by seizure in 2014.  Patient never required any seizure medications thereafter.   No sick contacts. She is a Education administrator.  Husband states that she has been drinking tequila at least 2 shots a day for past 1 month noted to her be slightly tremorous for the past few days has not been able to drink any alcohol secondary to nausea and vomiting.  States she has history of gastric ulcers and used to drink wine but since development of gastric ulcers the wind was not agreeing with her so she switched to tequila family denies history of.  Blood per rectum but did report there was small amount of blood in 1 of the vomitus today after significant retching    Subjective: 3/24 A/O x4, negative CP, negative S OB, negative  abdominal pain.  Only complaint is thirst.   Assessment & Plan:   Active Problems:   Seizure (HCC)   Abnormal EKG   HTN (hypertension)   Hyponatremia   Nausea & vomiting   Dehydration   Leukocytosis   Hypokalemia   Prolonged QT interval   Post-ictal state (HCC)  Seizure -Most likely secondary to combination EtOH abuse+ substance abuse+ gastrointestinal illness. - Seizure medication per neurology  Keppra 500 mg BID  Patient and husband counseled that she cannot drive nor operate heavy machinery for 6 months.  Counseled that the only physician that can clear her is with a neurologist.  Patient counseled that alcohol, marijuana does not mix with antiseizure medications and she must abstain.  Nausea and vomiting: -Most likely secondary to EtOH abuse, and gastritis - CT abdomen negative for acute etiology -Patient still dehydrated as evidenced by extremely dry mucous membranes and positive skin tenting -Normal saline 115ml/hr  Hypokalemia - Secondary to nausea and vomiting. - Potassium IV 60 mEq   Abnormal EKG/Prolonged QT interval -EKG unchanged from prior presentation. -Trend cardiac enzymes - Continue patient on telemetry for monitoring of QT prolongation.  Most likely secondary to electrolyte abnormality -Repeat EKG 3/25 - Echocardiogram pending  Essential HTN - 3/24 restart Losartan 25 mg daily (home dose 50 mg daily)  Cardiac murmur - Per patient has been chronic does not appear to have been worked up though.  EtOH abuse -Continue CIWA protocol  -Thiamine 100 mg daily -Folic acid  1 mg daily  Polysubstance abuse -3/23 UDS positive for benzos and marijuana -Consult social work for substance abuse resources -Spoke at [employer] with husband and patient concerning need to refer to absolutely refrain from using illegal substances while on antiseizure medication    DVT prophylaxis: Subcu heparin Code Status: Full Family Communication: Has been present at  bedside Disposition Plan: TBD   Consultants:  Neurology    Procedures/Significant Events:     I have personally reviewed and interpreted all radiology studies and my findings are as above.  VENTILATOR SETTINGS:    Cultures   Antimicrobials: Antibiotics Given (last 72 hours)    None       Devices    LINES / TUBES:      Continuous Infusions: . levETIRAcetam Stopped (05/01/17 1122)     Objective: Vitals:   04/30/17 2100 05/01/17 0100 05/01/17 0500 05/01/17 0511  BP: 114/79  100/79 (!) 145/75  Pulse: 89  75   Resp: 20  19   Temp:   97.6 F (36.4 C)   TempSrc:   Axillary   SpO2: 99%  93%   Weight:  140 lb 3.4 oz (63.6 kg)    Height:  4\' 11"  (1.499 m)      Intake/Output Summary (Last 24 hours) at 05/01/2017 1646 Last data filed at 04/30/2017 1914 Gross per 24 hour  Intake 100 ml  Output -  Net 100 ml   Filed Weights   05/01/17 0100  Weight: 140 lb 3.4 oz (63.6 kg)    Examination:  General: A/O x4, No acute respiratory distress Eyes: negative scleral hemorrhage, negative anisocoria, negative icterus ENT: Negative Runny nose, negative gingival bleeding, Neck:  Negative scars, masses, torticollis, lymphadenopathy, JVD Lungs: Clear to auscultation bilaterally without wheezes or crackles Cardiovascular: Regular rate and rhythm, positive 3/6 systolic murmur, negative gallop or rub normal S1 and S2 Abdomen: Obese, negative, abdominal pain, nondistended, positive soft, bowel sounds, no rebound, no ascites, no appreciable mass Extremities: No significant cyanosis, clubbing, or edema bilateral lower extremities Skin: Negative rashes, lesions, ulcers Psychiatric:  Negative depression, negative anxiety, negative fatigue, negative mania, poor understanding of disease process Central nervous system:  Cranial nerves II through XII intact, tongue/uvula midline, all extremities muscle strength 5/5, sensation intact throughout, egative dysarthria, negative  expressive aphasia, negative receptive aphasia.  .     Data Reviewed: Care during the described time interval was provided by me .  I have reviewed this patient's available data, including medical history, events of note, physical examination, and all test results as part of my evaluation.   CBC: Recent Labs  Lab 04/30/17 1428 05/01/17 0422  WBC 17.4* 13.8*  HGB 14.6 15.0  HCT 41.5 44.2  MCV 86.1 88.8  PLT 273 270   Basic Metabolic Panel: Recent Labs  Lab 04/30/17 1428 04/30/17 1950 05/01/17 0422 05/01/17 1036 05/01/17 1403  NA 127*  --  131* 129* 129*  K 2.8*  --  3.0* 2.8* 2.8*  CL 91*  --  93* 95* 97*  CO2 20*  --  21* 20* 20*  GLUCOSE 123*  --  63* 108* 106*  BUN 18  --  7 6 5*  CREATININE 0.95  --  0.68 0.67 0.70  CALCIUM 9.7  --  8.8* 8.7* 8.4*  MG  --  1.5* 1.8  --   --   PHOS  --  3.3 2.2*  --   --    GFR: Estimated Creatinine Clearance: 58.4 mL/min (by C-G formula  based on SCr of 0.7 mg/dL). Liver Function Tests: Recent Labs  Lab 04/30/17 1428 05/01/17 0422  AST 65* 76*  ALT 25 27  ALKPHOS 61 57  BILITOT 1.3* 1.5*  PROT 7.9 7.2  ALBUMIN 4.7 4.1   No results for input(s): LIPASE, AMYLASE in the last 168 hours. No results for input(s): AMMONIA in the last 168 hours. Coagulation Profile: No results for input(s): INR, PROTIME in the last 168 hours. Cardiac Enzymes: Recent Labs  Lab 04/30/17 1950  CKTOTAL 2,037*   BNP (last 3 results) No results for input(s): PROBNP in the last 8760 hours. HbA1C: No results for input(s): HGBA1C in the last 72 hours. CBG: Recent Labs  Lab 04/30/17 1440  GLUCAP 133*   Lipid Profile: No results for input(s): CHOL, HDL, LDLCALC, TRIG, CHOLHDL, LDLDIRECT in the last 72 hours. Thyroid Function Tests: Recent Labs    05/01/17 0422  TSH 2.499   Anemia Panel: No results for input(s): VITAMINB12, FOLATE, FERRITIN, TIBC, IRON, RETICCTPCT in the last 72 hours. Urine analysis:    Component Value Date/Time    COLORURINE YELLOW 04/30/2017 1637   APPEARANCEUR CLEAR 04/30/2017 1637   LABSPEC 1.020 04/30/2017 1637   PHURINE 6.0 04/30/2017 1637   GLUCOSEU NEGATIVE 04/30/2017 1637   HGBUR LARGE (A) 04/30/2017 1637   BILIRUBINUR NEGATIVE 04/30/2017 1637   KETONESUR 20 (A) 04/30/2017 1637   PROTEINUR 100 (A) 04/30/2017 1637   UROBILINOGEN 0.2 09/10/2010 0951   NITRITE NEGATIVE 04/30/2017 1637   LEUKOCYTESUR NEGATIVE 04/30/2017 1637   Sepsis Labs: @LABRCNTIP (procalcitonin:4,lacticidven:4)  )No results found for this or any previous visit (from the past 240 hour(s)).       Radiology Studies: Ct Head Wo Contrast  Result Date: 04/30/2017 CLINICAL DATA:  Seizure.  Acute mental status change. EXAM: CT HEAD WITHOUT CONTRAST TECHNIQUE: Contiguous axial images were obtained from the base of the skull through the vertex without intravenous contrast. COMPARISON:  March 31, 2012 FINDINGS: Brain: No evidence of acute infarction, hemorrhage, hydrocephalus, extra-axial collection or mass lesion/mass effect. Vascular: No hyperdense vessel or unexpected calcification. Skull: Normal. Negative for fracture or focal lesion. Sinuses/Orbits: No acute finding. Other: None. IMPRESSION: No cause for seizure identified.  No acute abnormality. Electronically Signed   By: Gerome Samavid  Williams III M.D   On: 04/30/2017 17:25   Mr Brain W Wo Contrast  Result Date: 05/01/2017 CLINICAL DATA:  Altered mental status and seizure EXAM: MRI HEAD WITHOUT AND WITH CONTRAST TECHNIQUE: Multiplanar, multiecho pulse sequences of the brain and surrounding structures were obtained without and with intravenous contrast. CONTRAST:  12mL MULTIHANCE GADOBENATE DIMEGLUMINE 529 MG/ML IV SOLN COMPARISON:  Head CT 04/30/2017 Brain MRI 04/01/2012 FINDINGS: Brain: The midline structures are normal. There is no acute infarct or acute hemorrhage. No mass lesion, hydrocephalus, dural abnormality or extra-axial collection. Minimal white matter hyperintensity,  nonspecific and commonly seen in asymptomatic patients of this age. No age-advanced or lobar predominant atrophy. No chronic microhemorrhage or superficial siderosis. Vascular: Major intracranial arterial and venous sinus flow voids are preserved. Skull and upper cervical spine: The visualized skull base, calvarium, upper cervical spine and extracranial soft tissues are normal. Sinuses/Orbits: Small amount of left mastoid fluid.  Normal orbits. IMPRESSION: No acute abnormality. Minimal white matter hyperintensity, which is nonspecific but may indicate mild chronic small vessel disease. Electronically Signed   By: Deatra RobinsonKevin  Herman M.D.   On: 05/01/2017 06:56   Ct Abdomen Pelvis W Contrast  Result Date: 04/30/2017 CLINICAL DATA:  Abdominal pain with nausea and vomiting  EXAM: CT ABDOMEN AND PELVIS WITH CONTRAST TECHNIQUE: Multidetector CT imaging of the abdomen and pelvis was performed using the standard protocol following bolus administration of intravenous contrast. CONTRAST:  ISOVUE-300 IOPAMIDOL (ISOVUE-300) INJECTION 61% COMPARISON:  04/02/2012 FINDINGS: Lower chest: Lung bases are free of acute infiltrate or sizable effusion. Vague ground-glass density is noted in the left lower lobe in the subpleural location measuring approximately 6 mm in greatest dimension. This is likely postinflammatory in nature. Hepatobiliary: Mild fatty infiltration of the liver is noted. Gallbladder is within normal limits. Pancreas: Unremarkable. No pancreatic ductal dilatation or surrounding inflammatory changes. Spleen: Normal in size without focal abnormality. Adrenals/Urinary Tract: Adrenal glands are within normal limits. Kidneys are well visualized bilaterally without obstructive change. Nonobstructing stone is noted in the midportion of the left kidney measuring 6 mm. Bladder is partially decompressed. Stomach/Bowel: The appendix is within normal limits. Transient small bowel intussusception is again noted similar to that  seen in 2014. No obstructive changes are seen. No inflammatory changes are noted. Vascular/Lymphatic: Aortic atherosclerosis. No enlarged abdominal or pelvic lymph nodes. Reproductive: Fibroid uterus is noted.  No adnexal mass is seen. Other: No abdominal wall hernia or abnormality. No abdominopelvic ascites. Musculoskeletal: Degenerative changes of the lumbar spine are noted. IMPRESSION: Nonobstructing left renal stone. Transient small bowel intussusception in the left mid abdomen without obstructive change. 6 mm ground-glass density in the left lower lobe likely postinflammatory in nature. Initial follow-up with CT at 6-12 months is recommended to confirm persistence. If persistent, repeat CT is recommended every 2 years until 5 years of stability has been established. This recommendation follows the consensus statement: Guidelines for Management of Incidental Pulmonary Nodules Detected on CT Images: From the Fleischner Society 2017; Radiology 2017; 284:228-243. Stable fibroid uterus. Electronically Signed   By: Alcide Clever M.D.   On: 04/30/2017 17:30        Scheduled Meds: . folic acid  1 mg Intravenous Daily  . mometasone-formoterol  2 puff Inhalation BID  . thiamine  100 mg Intravenous Daily   Continuous Infusions: . levETIRAcetam Stopped (05/01/17 1122)     LOS: 1 day    Time spent: 40 minutes    Patte Winkel, Roselind Messier, MD [facility] Pager [phone]   If 7PM-7AM, please contact night-coverage [email or web address] Password [facility] 05/01/2017, 4:46 PM

## 2017-05-01 NOTE — Progress Notes (Signed)
PT Cancellation Note  Patient Details Name: Courtney Miles MRN: [id number] DOB: 07/17/1955   Cancelled Treatment:    Reason Eval/Treat Not Completed: Fatigue/lethargy limiting ability to participate: sleeping or falls asleep during conversation.  Given medical status, recommend deferring mobility eval to next date to allow adequate rest, more accurate picture of home function to be observed and more accurate follow up recommendations to be made.     Narda AmberMartin, Maliha Outten [facility] 05/01/2017, 2:01 PM

## 2017-05-01 NOTE — Progress Notes (Signed)
Pt off floor to MRI

## 2017-05-01 NOTE — Progress Notes (Signed)
EEG complete - results pending 

## 2017-05-01 NOTE — Progress Notes (Signed)
OT Cancellation Note  Patient Details Name: Courtney Miles MRN: [id number] DOB: 05/31/1955   Cancelled Treatment:    Reason Eval/Treat Not Completed: Fatigue/lethargy limiting ability to participate. Pt found naked under blankets and extremely lethargic, OT will continue to follow for evaluation and reassess tomorrow.  Evern BioLaura J Shunsuke Granzow 05/01/2017, 2:02 PM  Sherryl MangesLaura Montez Stryker OTR/L [phone]

## 2017-05-01 NOTE — Progress Notes (Signed)
Pt disoriented X 4. Refused initial VS; combative and confused. Unable to complete full initial assessment. Pt will answer "yes" or ask "What are you doing?". Will continue to monitor.

## 2017-05-02 ENCOUNTER — Inpatient Hospital Stay (HOSPITAL_COMMUNITY): Payer: BLUE CROSS/BLUE SHIELD

## 2017-05-02 DIAGNOSIS — I351 Nonrheumatic aortic (valve) insufficiency: Secondary | ICD-10-CM

## 2017-05-02 DIAGNOSIS — R9431 Abnormal electrocardiogram [ECG] [EKG]: Secondary | ICD-10-CM

## 2017-05-02 DIAGNOSIS — E871 Hypo-osmolality and hyponatremia: Secondary | ICD-10-CM

## 2017-05-02 DIAGNOSIS — R112 Nausea with vomiting, unspecified: Secondary | ICD-10-CM

## 2017-05-02 DIAGNOSIS — E876 Hypokalemia: Secondary | ICD-10-CM

## 2017-05-02 LAB — CBC
HCT: 41.5 % (ref 36.0–46.0)
HEMOGLOBIN: 14 g/dL (ref 12.0–15.0)
MCH: 29.4 pg (ref 26.0–34.0)
MCHC: 33.7 g/dL (ref 30.0–36.0)
MCV: 87 fL (ref 78.0–100.0)
Platelets: 256 10*3/uL (ref 150–400)
RBC: 4.77 MIL/uL (ref 3.87–5.11)
RDW: 13.4 % (ref 11.5–15.5)
WBC: 9 10*3/uL (ref 4.0–10.5)

## 2017-05-02 LAB — BASIC METABOLIC PANEL
ANION GAP: 13 (ref 5–15)
ANION GAP: 13 (ref 5–15)
BUN: 7 mg/dL (ref 6–20)
BUN: 6 mg/dL (ref 6–20)
CALCIUM: 8.3 mg/dL — AB (ref 8.9–10.3)
CO2: 18 mmol/L — ABNORMAL LOW (ref 22–32)
CO2: 20 mmol/L — ABNORMAL LOW (ref 22–32)
Calcium: 8 mg/dL — ABNORMAL LOW (ref 8.9–10.3)
Chloride: 98 mmol/L — ABNORMAL LOW (ref 101–111)
Chloride: 98 mmol/L — ABNORMAL LOW (ref 101–111)
Creatinine, Ser: 0.68 mg/dL (ref 0.44–1.00)
Creatinine, Ser: 0.68 mg/dL (ref 0.44–1.00)
GFR calc Af Amer: >60 mL/min (ref >60)
GFR calc Af Amer: >60 mL/min (ref >60)
GLUCOSE: 106 mg/dL — AB (ref 65–99)
GLUCOSE: 113 mg/dL — AB (ref 65–99)
Potassium: 4.5 mmol/L (ref 3.5–5.1)
Potassium: 3.1 mmol/L — ABNORMAL LOW (ref 3.5–5.1)
SODIUM: 129 mmol/L — AB (ref 135–145)
Sodium: 131 mmol/L — ABNORMAL LOW (ref 135–145)

## 2017-05-02 LAB — ECHOCARDIOGRAM COMPLETE
HEIGHTINCHES: 59 in
WEIGHTICAEL: 2243.4 [oz_av]

## 2017-05-02 LAB — MAGNESIUM: MAGNESIUM: 1.9 mg/dL (ref 1.7–2.4)

## 2017-05-02 LAB — HIV ANTIBODY (ROUTINE TESTING W REFLEX): HIV Screen 4th Generation wRfx: NONREACTIVE

## 2017-05-02 MED ORDER — METOPROLOL SUCCINATE ER 25 MG PO TB24
50.0000 mg | ORAL_TABLET | Freq: Every evening | ORAL | Status: DC
  Administered 2017-05-02 – 2017-05-03 (×2): 50 mg via ORAL
  Filled 2017-05-02 (×2): qty 2

## 2017-05-02 MED ORDER — ACETAMINOPHEN 500 MG PO TABS: 500.0000 mg | ORAL_TABLET | Freq: Four times a day (QID) | ORAL | Status: DC | PRN

## 2017-05-02 MED ORDER — POTASSIUM CHLORIDE CRYS ER 20 MEQ PO TBCR
40.0000 meq | EXTENDED_RELEASE_TABLET | Freq: Two times a day (BID) | ORAL | Status: AC
  Administered 2017-05-02 – 2017-05-03 (×3): 40 meq via ORAL
  Filled 2017-05-02 (×4): qty 2

## 2017-05-02 MED ORDER — CYANOCOBALAMIN 500 MCG PO TABS
500.0000 ug | ORAL_TABLET | Freq: Every day | ORAL | Status: DC
  Administered 2017-05-02 – 2017-05-04 (×3): 500 ug via ORAL
  Filled 2017-05-02 (×3): qty 1

## 2017-05-02 MED ORDER — VITAMIN D 1000 UNITS PO TABS
4000.0000 [IU] | ORAL_TABLET | Freq: Every day | ORAL | Status: DC
  Administered 2017-05-02 – 2017-05-04 (×3): 4000 [IU] via ORAL
  Filled 2017-05-02 (×5): qty 4

## 2017-05-02 MED ORDER — FOLIC ACID 1 MG PO TABS
1.0000 mg | ORAL_TABLET | Freq: Every day | ORAL | Status: DC
  Administered 2017-05-02 – 2017-05-04 (×3): 1 mg via ORAL
  Filled 2017-05-02 (×3): qty 1

## 2017-05-02 MED ORDER — VITAMIN B-1 100 MG PO TABS
100.0000 mg | ORAL_TABLET | Freq: Every day | ORAL | Status: DC
Start: 2017-05-02 — End: 2017-05-04
  Administered 2017-05-02 – 2017-05-04 (×3): 100 mg via ORAL
  Filled 2017-05-02 (×3): qty 1

## 2017-05-02 MED ORDER — PROCHLORPERAZINE EDISYLATE 5 MG/ML IJ SOLN
10.0000 mg | Freq: Four times a day (QID) | INTRAMUSCULAR | Status: DC | PRN
  Administered 2017-05-02: 10 mg via INTRAVENOUS
  Filled 2017-05-02: qty 2

## 2017-05-02 NOTE — Progress Notes (Signed)
  Echocardiogram 2D Echocardiogram has been performed.  Courtney Miles Courtney Miles 05/02/2017, 11:31 AM

## 2017-05-02 NOTE — Progress Notes (Signed)
OT Cancellation Note  Patient Details Name: Courtney Miles MRN: [id number] DOB: 04/30/1955   Cancelled Treatment:    Reason Eval/Treat Not Completed: Fatigue/lethargy limiting ability to participate.  Will reattempt tomorrow.  Yareli Carthen [place], OTR/L [phone]   Courtney Miles, Courtney Miles 05/02/2017, 7:18 PM

## 2017-05-02 NOTE — Progress Notes (Signed)
PT Cancellation Note  Patient Details Name: Courtney Miles MRN: [id number] DOB: 08/10/1955   Cancelled Treatment:    Reason Eval/Treat Not Completed: Fatigue/lethargy limiting ability to participate Per RN, pt lethargic and not responding well this AM. Rn requests PT to check on later when pt more arousable and able to participate in PT.   Courtney Miles 05/02/2017, 10:24 AM Courtney Miles, PT, DPT [phone]

## 2017-05-02 NOTE — Evaluation (Signed)
Physical Therapy Evaluation Patient Details Name: Courtney Miles MRN: [id number] DOB: Mar 28, 1955 Today's Date: 05/02/2017   History of Present Illness  Pt. is a 62 y.o. F with significant PMH of hypertension, depression, prior episode of seizure and heart murmur. Patient admitted following seizure and postictal episode with accompanying 2 days of nausea, vomiting and nonbloody diarrhea. Of note, patient consumes 2 tequila shots per day.      Clinical Impression  Pt admitted with above diagnosis. Pt currently with functional limitations due to the deficits listed below (see PT Problem List). Patient main deficits include diminished balance and decreased safety awareness. 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 At the time of PT evaluation, patient requiring min-mod assist to maintain balance during dynamic gait due to increased lateral sway and lean. Could benefit from gait training with an assistive device if balance deficits persist. Suspect patient will progress quickly based on age and PLOF and has good family support. Pt will benefit from skilled PT to increase their independence and safety with mobility to allow discharge to the venue listed below.       Follow Up Recommendations Home health PT;Supervision for mobility/OOB    Equipment Recommendations  Other (comment)(TBD)    Recommendations for Other Services       Precautions / Restrictions Precautions Precautions: Fall Restrictions Weight Bearing Restrictions: No      Mobility  Bed Mobility Overal bed mobility: Modified Independent             General bed mobility comments: Modified independent with supine to sit  Transfers Overall transfer level: Needs assistance Equipment used: None Transfers: Sit to/from Stand Sit to Stand: Supervision         General transfer comment: Supervision for safety with transfers.  Ambulation/Gait Ambulation/Gait assistance: Min  assist;Mod assist Ambulation Distance (Feet): 150 Feet Assistive device: 1 person hand held assist Gait Pattern/deviations: Narrow base of support;Drifts right/left;Decreased dorsiflexion - right;Decreased dorsiflexion - left;Decreased stride length   Gait velocity interpretation: Below normal speed for age/gender General Gait Details: Patient ambulating intermittently with arm behind PT's back versus HHA for balance. Min-mod assist required for LOB due to lateral sway and posterior lean. Patient very unsteady, especially with dual tasking (i.e. talking or head turns) and has decreased safety awareness with balance deficits.   Stairs Stairs: Yes Stairs assistance: Min guard Stair Management: Two rails Number of Stairs: 5 General stair comments: Step by step pattern. VC's for ensuring foot was fully on step.  Wheelchair Mobility    Modified Rankin (Stroke Patients Only)       Balance Overall balance assessment: Needs assistance Sitting-balance support: No upper extremity supported;Feet unsupported Sitting balance-Leahy Scale: Good     Standing balance support: No upper extremity supported Standing balance-Leahy Scale: Fair                               Pertinent Vitals/Pain Pain Assessment: Faces Faces Pain Scale: Hurts little more Pain Location: IV site Pain Descriptors / Indicators: Sharp;Shooting Pain Intervention(s): Other (comment)(RN present and disconnected IV)    Home Living Family/patient expects to be discharged to:: Private residence Living Arrangements: Spouse/significant other Available Help at Discharge: Family Type of Home: House Home Access: Stairs to enter Entrance Stairs-Rails: Can reach both Entrance Stairs-Number of Steps: 8 Home Layout: Multi-level Home Equipment: None      Prior Function Level of Independence: Independent  Comments: 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      Hand Dominance        Extremity/Trunk Assessment   Upper Extremity Assessment Upper Extremity Assessment: Overall WFL for tasks assessed    Lower Extremity Assessment Lower Extremity Assessment: Overall WFL for tasks assessed    Cervical / Trunk Assessment Cervical / Trunk Assessment: Normal  Communication   Communication: No difficulties  Cognition Arousal/Alertness: Suspect due to medications Behavior During Therapy: WFL for tasks assessed/performed Overall Cognitive Status: Impaired/Different from baseline Area of Impairment: Safety/judgement                         Safety/Judgement: Decreased awareness of safety;Decreased awareness of deficits            General Comments General comments (skin integrity, edema, etc.): Patient son present throughout and very attentive to patient and engaged.     Exercises     Assessment/Plan    PT Assessment Patient needs continued PT services  PT Problem List Decreased balance;Decreased mobility;Decreased safety awareness       PT Treatment Interventions DME instruction;Gait training;Stair training;Functional mobility training;Therapeutic activities;Therapeutic exercise;Balance training;Patient/family education    PT Goals (Current goals can be found in the Care Plan section)  Acute Rehab PT Goals Patient Stated Goal: Doesn't want to get sick again PT Goal Formulation: With patient Time For Goal Achievement: 05/16/17 Potential to Achieve Goals: Good    Frequency Min 4X/week   Barriers to discharge        Co-evaluation               AM-PAC PT "6 Clicks" Daily Activity  Outcome Measure Difficulty turning over in bed (including adjusting bedclothes, sheets and blankets)?: None Difficulty moving from lying on back to sitting on the side of the bed? : A Little Difficulty sitting down on and standing up from a chair with arms (e.g., wheelchair, bedside commode, etc,.)?: A Little Help needed moving  to and from a bed to chair (including a wheelchair)?: A Little Help needed walking in hospital room?: A Little Help needed climbing 3-5 steps with a railing? : A Little 6 Click Score: 19    End of Session Equipment Utilized During Treatment: Gait belt Activity Tolerance: Patient tolerated treatment well Patient left: in chair;with call bell/phone within reach;with chair alarm set;with family/visitor present Nurse Communication: Mobility status PT Visit Diagnosis: Unsteadiness on feet (R26.81);Difficulty in walking, not elsewhere classified (R26.2)    Time: [id number] PT Time Calculation (min) (ACUTE ONLY): 28 min   Charges:   PT Evaluation $PT Eval Moderate Complexity: 1 Mod PT Treatments $Therapeutic Activity: 8-22 mins   PT G Codes:        Laurina Bustlearoline Modean Mccullum, PT, DPT Acute Rehabilitation Services  Pager: [phone]  Vanetta MuldersCarloine H Tamakia Porto 05/02/2017, 12:31 PM

## 2017-05-02 NOTE — Progress Notes (Addendum)
[facility] TEAM 1 - Stepdown/ICU TEAM  ARCADIA GORGAS  MVH:[id number] DOB: 09-28-1955 DOA: 04/30/2017 PCP: System, Provider Not In    Brief Narrative:  62 y.o.F w/ a Hx of Depression, Seizure (secondary to illness), Polysubstance abuse, EtOH abuse, HTN, and a Heart murmur who presented with2 daysof nausea, vomiting, and diarrhea.  Over the course of her illness the patient became more lethargic, and then had a witnessed seizure with tongue biting. EMS administered 5 mg of Versed and she was taken to the ED.    Husband stated she has been drinking at least 2 shots of tequilla a day for at least 1 month.  Subjective: Pt is lethargic when I enter the room.  She wakes up as I am speaking to her husband.  She becomes extremely agitated / angry when I state that I suspect her seizure was due to EtOH withdrawal brought about by her inability to consume EtOH due to intractable vomiting.  I explained to her that my suggestion is that she never drink EtOH again.  She then essentially refused to talk to me the remainder of my time in the room.  I continued to discuss the tx plan w/ her husband.  He asked that her Keppra be stopped, and the pt nodded agreement.  They are concerned it is making her drowsy, and will "worsen her depression."    Assessment & Plan:  Seizure likely due to EtOH withdrawal in setting of gastrointestinal illness > intractable vomiting - pt does not want to take keppra any longer, so I am stopping it at her request - I do not feel this is unreasonable, as her hx suggests each of her 2 seizures were brought about by the inability to consume EtOH due to other illness - I discussed w/ Neuro via phone, who suggested a trial of Vimpat instead - I will address this w/ the pt tomorrow on rounds   Nausea and vomiting Viral GI illness v/s  EtOH associated gastritis - CT abdomen negative for acute etiology - appears rehydrated at this time - stop IVF and folow   Hypokalemia Secondary  to nausea and vomiting - supplement further to goal of 4.0  Abnormal EKG / Prolonged QT interval EKG unchanged from prior presentation - continue telemetry - check EKG in AM   HTN Follow w/o change today   Cardiac murmur chronic   EtOH abuse Pt vehemently denies and becomes visibly angry when I counsel her on abstinence - I assured her I am not judging her, but rather attempting to take the best care of her that I can - she refused to talk to me further   DVT prophylaxis: SQ heparin  Code Status: FULL CODE Family Communication: no family present at time of exam  Disposition Plan: tranfer to tele - ambulate - follow off keppra - discuss possible use of Vimpat - had been counseled by Neuro that she may not drive for 6 months minimum   Consultants:  Neurology   Antimicrobials:  none  Objective: Blood pressure (!) 148/93, pulse 98, temperature 97.9 F (36.6 C), temperature source Oral, resp. rate 16, height 4\' 11"  (1.499 m), weight 63.6 kg (140 lb 3.4 oz), SpO2 99 %.  Intake/Output Summary (Last 24 hours) at 05/02/2017 1404 Last data filed at 05/02/2017 1204 Gross per 24 hour  Intake 1075 ml  Output -  Net 1075 ml   Filed Weights   05/01/17 0100  Weight: 63.6 kg (140 lb 3.4 oz)  Examination: General: No acute respiratory distress Lungs: Clear to auscultation bilaterally without wheezes or crackles Cardiovascular: Regular rate and rhythm without gallop or rub normal S1 and S2 Abdomen: Nontender, nondistended, soft, bowel sounds positive, no rebound, no ascites, no appreciable mass Extremities: No significant cyanosis, clubbing, or edema bilateral lower extremities  CBC: Recent Labs  Lab 04/30/17 1428 05/01/17 0422 05/02/17 0615  WBC 17.4* 13.8* 9.0  HGB 14.6 15.0 14.0  HCT 41.5 44.2 41.5  MCV 86.1 88.8 87.0  PLT 273 270 256   Basic Metabolic Panel: Recent Labs  Lab 04/30/17 1950 05/01/17 0422  05/01/17 2120 05/02/17 0151 05/02/17 0615  NA  --  131*    < > 130* 129* 131*  K  --  3.0*   < > 2.9* 4.5 3.1*  CL  --  93*   < > 96* 98* 98*  CO2  --  21*   < > 22 18* 20*  GLUCOSE  --  63*   < > 113* 106* 113*  BUN  --  7   < > 6 7 6   CREATININE  --  0.68   < > 0.78 0.68 0.68  CALCIUM  --  8.8*   < > 8.6* 8.0* 8.3*  MG 1.5* 1.8  --   --   --  1.9  PHOS 3.3 2.2*  --   --   --   --    < > = values in this interval not displayed.   GFR: Estimated Creatinine Clearance: 58.4 mL/min (by C-G formula based on SCr of 0.68 mg/dL).  Liver Function Tests: Recent Labs  Lab 04/30/17 1428 05/01/17 0422  AST 65* 76*  ALT 25 27  ALKPHOS 61 57  BILITOT 1.3* 1.5*  PROT 7.9 7.2  ALBUMIN 4.7 4.1    Cardiac Enzymes: Recent Labs  Lab 04/30/17 1950  CKTOTAL 2,037*    HbA1C: Hgb A1c MFr Bld  Date/Time Value Ref Range Status  09/10/2010 09:52 AM 5.4 <5.7 % Final    Comment:    (NOTE)                                                                       According to the ADA Clinical Practice Recommendations for 2011, when HbA1c is used as a screening test:  >=6.5%   Diagnostic of Diabetes Mellitus           (if abnormal result is confirmed) 5.7-6.4%   Increased risk of developing Diabetes Mellitus References:Diagnosis and Classification of Diabetes Mellitus,Diabetes Care,2011,34(Suppl 1):S62-S69 and Standards of Medical Care in         Diabetes - 2011,Diabetes Care,2011,34 (Suppl 1):S11-S61.    CBG: Recent Labs  Lab 04/30/17 1440  GLUCAP 133*    Scheduled Meds: . folic acid  1 mg Intravenous Daily  . heparin injection (subcutaneous)  5,000 Units Subcutaneous Q8H  . losartan  25 mg Oral Daily  . mometasone-formoterol  2 puff Inhalation BID  . thiamine  100 mg Intravenous Daily     LOS: 2 days   Lonia BloodJeffrey T. McClung, MD [facility] Office  [phone] Pager - Text Page per Amion as per below:  On-Call/Text Page:      [email or web address]  password [email or web address]  If 7PM-7AM, please contact night-coverage [email or web address] Password  [facility] 05/02/2017, 2:04 PM

## 2017-05-02 NOTE — Progress Notes (Signed)
OT Cancellation Note  Patient Details Name: Courtney Miles MRN: [id number] DOB: 09/08/1955   Cancelled Treatment:    Reason Eval/Treat Not Completed: Fatigue/lethargy limiting ability to participate.  As per PT note, will try back later this pm.   Jeani HawkingWendi Fabio Wah, OTR/L [phone]   Jeani HawkingConarpe, Melanie Openshaw M 05/02/2017, 10:26 AM

## 2017-05-03 DIAGNOSIS — R569 Unspecified convulsions: Secondary | ICD-10-CM

## 2017-05-03 LAB — CBC
HCT: 40.9 % (ref 36.0–46.0)
Hemoglobin: 13.8 g/dL (ref 12.0–15.0)
MCH: 29.7 pg (ref 26.0–34.0)
MCHC: 33.7 g/dL (ref 30.0–36.0)
MCV: 88 fL (ref 78.0–100.0)
PLATELETS: 257 10*3/uL (ref 150–400)
RBC: 4.65 MIL/uL (ref 3.87–5.11)
RDW: 13.6 % (ref 11.5–15.5)
WBC: 7.6 10*3/uL (ref 4.0–10.5)

## 2017-05-03 LAB — COMPREHENSIVE METABOLIC PANEL
ALT: 26 U/L (ref 14–54)
AST: 30 U/L (ref 15–41)
Albumin: 3.4 g/dL — ABNORMAL LOW (ref 3.5–5.0)
Alkaline Phosphatase: 52 U/L (ref 38–126)
Anion gap: 11 (ref 5–15)
BILIRUBIN TOTAL: 1.1 mg/dL (ref 0.3–1.2)
BUN: 8 mg/dL (ref 6–20)
CALCIUM: 8.6 mg/dL — AB (ref 8.9–10.3)
CHLORIDE: 100 mmol/L — AB (ref 101–111)
CO2: 23 mmol/L (ref 22–32)
CREATININE: 0.71 mg/dL (ref 0.44–1.00)
Glucose, Bld: 94 mg/dL (ref 65–99)
Potassium: 2.7 mmol/L — CL (ref 3.5–5.1)
Sodium: 134 mmol/L — ABNORMAL LOW (ref 135–145)
TOTAL PROTEIN: 6 g/dL — AB (ref 6.5–8.1)

## 2017-05-03 LAB — PHOSPHORUS: PHOSPHORUS: 2.6 mg/dL (ref 2.5–4.6)

## 2017-05-03 LAB — MAGNESIUM: MAGNESIUM: 2 mg/dL (ref 1.7–2.4)

## 2017-05-03 MED ORDER — VALPROIC ACID 250 MG PO CAPS
500.0000 mg | ORAL_CAPSULE | Freq: Two times a day (BID) | ORAL | Status: DC
  Administered 2017-05-03 – 2017-05-04 (×2): 500 mg via ORAL
  Filled 2017-05-03 (×2): qty 2

## 2017-05-03 MED ORDER — ENSURE ENLIVE PO LIQD
237.0000 mL | Freq: Two times a day (BID) | ORAL | Status: DC
  Administered 2017-05-03: 237 mL via ORAL

## 2017-05-03 MED ORDER — VALPROIC ACID 250 MG PO CAPS: 500.0000 mg | ORAL_CAPSULE | Freq: Two times a day (BID) | ORAL | Status: DC

## 2017-05-03 MED ORDER — ESCITALOPRAM OXALATE 10 MG PO TABS
10.0000 mg | ORAL_TABLET | Freq: Every day | ORAL | Status: DC
  Administered 2017-05-03 – 2017-05-04 (×2): 10 mg via ORAL
  Filled 2017-05-03 (×2): qty 1

## 2017-05-03 MED ORDER — POTASSIUM CHLORIDE 10 MEQ/100ML IV SOLN
10.0000 meq | INTRAVENOUS | Status: AC
  Administered 2017-05-03 (×4): 10 meq via INTRAVENOUS
  Filled 2017-05-03 (×4): qty 100

## 2017-05-03 MED ORDER — POTASSIUM CHLORIDE CRYS ER 20 MEQ PO TBCR
40.0000 meq | EXTENDED_RELEASE_TABLET | Freq: Once | ORAL | Status: AC
  Administered 2017-05-03: 40 meq via ORAL
  Filled 2017-05-03: qty 2

## 2017-05-03 MED ORDER — LEVETIRACETAM 500 MG PO TABS: 500.0000 mg | ORAL_TABLET | Freq: Two times a day (BID) | ORAL | Status: DC

## 2017-05-03 NOTE — Evaluation (Signed)
Occupational Therapy Evaluation Patient Details Name: Courtney Miles MRN: [id number] DOB: 1955-12-18 Today's Date: 05/03/2017    History of Present Illness Pt. is a 62 y.o. F with significant PMH of hypertension, depression, prior episode of seizure and heart murmur. Patient admitted following seizure and postictal episode with accompanying 2 days of nausea, vomiting and nonbloody diarrhea. Of note, patient consumes 2 tequila shots per day.       Clinical Impression   PTA, pt was living with her husband and was independent with ADLs and IADLs; not driving at baseline. Currently, pt performing ADLs at [facility] level due to decreased balance and cognition. Pt presenting with decreased attention, initiation of tasks, processing, ST memory, and awareness impacting her safety during ADLs and functional mobility. Pt also presenting with decreased activity tolerance and fatigued quickly during grooming tasks at sink. Pt would benefit from further acute OT to facilitate safe dc. Recommend dc to home with HHOT for further OT to optimize safety, independence with ADLs, and return to PLOF.      Follow Up Recommendations  Home health OT;Supervision/Assistance - 24 hour    Equipment Recommendations  3 in 1 bedside commode    Recommendations for Other Services       Precautions / Restrictions Precautions Precautions: Fall Restrictions Weight Bearing Restrictions: No      Mobility Bed Mobility Overal bed mobility: Modified Independent             General bed mobility comments: Modified independent with supine to sit  Transfers Overall transfer level: Needs assistance Equipment used: None Transfers: Sit to/from Stand Sit to Stand: Supervision         General transfer comment: Supervision for safety with transfers.    Balance Overall balance assessment: Needs assistance Sitting-balance support: No upper extremity supported;Feet unsupported Sitting balance-Leahy Scale: Good      Standing balance support: No upper extremity supported Standing balance-Leahy Scale: Fair                             ADL either performed or assessed with clinical judgement   ADL Overall ADL's : Needs assistance/impaired Eating/Feeding: Set up;Sitting   Grooming: Oral care;Wash/dry face;Wash/dry hands;Min guard;Standing Grooming Details (indicate cue type and reason): Pt performing grooming tasks at sink with Min Guard for safety due to decreased balance and impulsivity. Pt fatiguing quickly and stating "I am just so tired. I dont understand." Pt demosntrating WFL for hand strength, grasp, and FM skills during oral care.  Upper Body Bathing: Set up;Sitting   Lower Body Bathing: Min guard;Sit to/from stand   Upper Body Dressing : Set up;Sitting   Lower Body Dressing: Min guard;Sit to/from stand Lower Body Dressing Details (indicate cue type and reason): Pt able to bring ankles to knees for adjusting socks Toilet Transfer: Min guard;Ambulation;Regular Toilet   Toileting- Architect and Hygiene: Supervision/safety;Sit to/from stand    Educated pt on use of 3N1 as shower seat due to decreased balance and activity tolerance. Pt stating she didn't need a shower seat at first, but then agreed after educating on fatigue and energy conservation.    Functional mobility during ADLs: Min guard General ADL Comments: Pt with decreased balance and cognition. Requiring Min-Mod VCs to attending to task.      Vision Baseline Vision/History: Wears glasses Wears Glasses: Reading only Patient Visual Report: Blurring of vision Vision Assessment?: Yes Eye Alignment: Within Functional Limits Ocular Range of Motion: Within Functional  Limits Alignment/Gaze Preference: Within Defined Limits Tracking/Visual Pursuits: Able to track stimulus in all quads without difficulty Convergence: Within functional limits Visual Fields: No apparent deficits Additional Comments: Reporting  blurry vision. Will continue to monitor.      Perception     Praxis      Pertinent Vitals/Pain Pain Assessment: No/denies pain     Hand Dominance Right   Extremity/Trunk Assessment Upper Extremity Assessment Upper Extremity Assessment: Overall WFL for tasks assessed(WFL strength and coordination)   Lower Extremity Assessment Lower Extremity Assessment: Overall WFL for tasks assessed   Cervical / Trunk Assessment Cervical / Trunk Assessment: Normal   Communication Communication Communication: No difficulties   Cognition Arousal/Alertness: Awake/alert Behavior During Therapy: WFL for tasks assessed/performed Overall Cognitive Status: Impaired/Different from baseline Area of Impairment: Safety/judgement;Problem solving;Awareness;Attention;Memory                   Current Attention Level: Sustained Memory: Decreased short-term memory   Safety/Judgement: Decreased awareness of safety;Decreased awareness of deficits Awareness: Emergent Problem Solving: Decreased initiation;Requires verbal cues General Comments: Pt distractable and difficulty maintaining attention to task and conversation. Recalling 2/3 ST memory words. Emotional lability with moments of happiness, excitment, and then crying. Feel pt is close to baseline and possible with poor attention and memory at baseline.    General Comments  Husband present throughout session    Exercises     Shoulder Instructions      Home Living Family/patient expects to be discharged to:: Private residence Living Arrangements: Spouse/significant other Available Help at Discharge: Family Type of Home: House Home Access: Stairs to enter Secretary/administratorntrance Stairs-Number of Steps: 8 Entrance Stairs-Rails: Can reach both Home Layout: Multi-level Alternate Level Stairs-Number of Steps: 8 Alternate Level Stairs-Rails: Can reach both Bathroom Shower/Tub: Tub/shower unit;Walk-in shower   Bathroom Toilet: Standard     Home  Equipment: None          Prior Functioning/Environment Level of Independence: Independent        Comments: 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        OT Problem List: Decreased activity tolerance;Impaired balance (sitting and/or standing);Impaired vision/perception;Decreased cognition;Decreased safety awareness      OT Treatment/Interventions: Self-care/ADL training;Therapeutic exercise;Energy conservation;DME and/or AE instruction;Therapeutic activities;Patient/family education;Visual/perceptual remediation/compensation;Cognitive remediation/compensation    OT Goals(Current goals can be found in the care plan section) Acute Rehab OT Goals Patient Stated Goal: [facility] OT Goal Formulation: With patient/family Time For Goal Achievement: 05/17/17 Potential to Achieve Goals: Good ADL Goals Pt Will Perform Grooming: standing;with modified independence Pt Will Transfer to Toilet: with modified independence;regular height toilet;ambulating Pt Will Perform Toileting - Clothing Manipulation and hygiene: with modified independence;sit to/from stand Additional ADL Goal #1: Pt will performing three step trail making task with 1-2 VCs Additional ADL Goal #2: Pt will demonstrate selective attention during ADLs with MIn VCs in a minimally distracting environment  OT Frequency: Min 2X/week   Barriers to D/C:            Co-evaluation              AM-PAC PT "6 Clicks" Daily Activity     Outcome Measure Help from another person eating meals?: None Help from another person taking care of personal grooming?: A Little Help from another person toileting, which includes using toliet, bedpan, or urinal?: A Little Help from another person bathing (including washing, rinsing, drying)?: A Little Help from another person to  put on and taking off regular upper body clothing?: A Little Help from another person to put on and taking  off regular lower body clothing?: A Little 6 Click Score: 19   End of Session Nurse Communication: Mobility status  Activity Tolerance: Patient tolerated treatment well Patient left: with call bell/phone within reach;with chair alarm set;in chair;with nursing/sitter in room  OT Visit Diagnosis: Unsteadiness on feet (R26.81);Other abnormalities of gait and mobility (R26.89);Muscle weakness (generalized) (M62.81);Other symptoms and signs involving cognitive function                Time: 1610-9604 OT Time Calculation (min): 31 min Charges:  OT General Charges $OT Visit: 1 Visit OT Evaluation $OT Eval Moderate Complexity: 1 Mod OT Treatments $Self Care/Home Management : 8-22 mins G-Codes:     Eliah Ozawa MSOT, OTR/L Acute Rehab Pager: [phone] Office: [phone]  Theodoro Grist Trevione Wert 05/03/2017, 9:12 AM

## 2017-05-03 NOTE — Significant Event (Signed)
CRITICAL VALUE STICKER  CRITICAL VALUE: 2.7 Potassium  RECEIVER (on-site recipient of call):  DATE & TIME NOTIFIED: 05/03/17 0445  MESSENGER (representative from lab):  MD NOTIFIED: K. Schorr  TIME OF NOTIFICATION: 0448 text page  RESPONSE: Awaiting response

## 2017-05-03 NOTE — Progress Notes (Addendum)
[place] TEAM 1 - Stepdown/ICU TEAM  Algis LimingCecilia M Mckercher  JXB:[id number] DOB: 06/29/1955 DOA: 04/30/2017 PCP: System, Provider Not In    Brief Narrative:  62 y.o.F w/ a Hx of Depression, Seizure (secondary to illness), Polysubstance abuse, EtOH abuse, HTN, and a Heart murmur who presented with2 daysof nausea, vomiting, and diarrhea.  Over the course of her illness the patient became more lethargic, and then had a witnessed seizure with tongue biting. EMS administered 5 mg of Versed and she was taken to the ED.    Husband stated she has been drinking at least 2 shots of tequilla a day for at least 1 month.  Subjective: Patient is alert, conversant. Son is at bedside. The concern they had with keppra was that she was notice to be more depress, lethargic. Patient ha been off lexapro for last few days. Patient denies suicidal thought.   Assessment & Plan:  Seizure likely due to EtOH withdrawal in setting of gastrointestinal illness > intractable vomiting - Patient and family were having concern with Keppra, they thought it was making her depress. Patient has been off her lexapro also. Vimpat has also adverse effect, mood swing.  I discussed case with neurology, he reported that keppra associated depression is less than 10 %, 90 % patient tolerates medication well. We could use valproic acid. Gave option to patient and son who was at bedside. Patient agree to take Keppra, if she has any adverse effect she will call PCP.   She denies suicidal thought and knows about safety plan. Son also assure me that family will be around.  Addendum; Patient was aggressive with staff, was agitated. She has not received keppra. I came back and discussed with patient. She would take medication I prescribe. I discussed case again with neurology, we could try better valproic acid.    .  Nausea and vomiting Viral GI illness v/s  EtOH associated gastritis - CT abdomen negative for acute etiology - appears  rehydrated at this time - stop IVF. Improved.    Hypokalemia IV kcl 4 runs and oral supplementation. Mg is normal.   Abnormal EKG / Prolonged QT interval EKG unchanged from prior presentation - continue telemetry - QT decreased   HTN Continue with metoprolol/   Cardiac murmur chronic  Moderate Aortic stenosis, mild regurgitation. Will need follow up ECHO and cardiology follow up.   EtOH abuse On CIWA>   DVT prophylaxis: SQ heparin  Code Status: FULL CODE Family Communication: no family present at time of exam  Disposition Plan: home in 24 hours if improved electrolytes.   Consultants:  Neurology   Antimicrobials:  none  Objective: Blood pressure 117/72, pulse 84, temperature 97.8 F (36.6 C), temperature source Oral, resp. rate 20, height 4\' 11"  (1.499 m), weight 63.6 kg (140 lb 3.4 oz), SpO2 100 %.  Intake/Output Summary (Last 24 hours) at 05/03/2017 1329 Last data filed at 05/03/2017 1017 Gross per 24 hour  Intake 720 ml  Output -  Net 720 ml   Filed Weights   05/01/17 0100  Weight: 63.6 kg (140 lb 3.4 oz)    Examination: General: No acute distress.  Lungs: CTA Cardiovascular: S 1, S 2 RRR Abdomen: BS present, soft, nt Extremities:  No edema CBC: Recent Labs  Lab 05/01/17 0422 05/02/17 0615 05/03/17 0338  WBC 13.8* 9.0 7.6  HGB 15.0 14.0 13.8  HCT 44.2 41.5 40.9  MCV 88.8 87.0 88.0  PLT 270 256 257   Basic Metabolic Panel: Recent Labs  Lab 04/30/17 1950 05/01/17 0422  05/02/17 0151 05/02/17 0615 05/03/17 0338  NA  --  131*   < > 129* 131* 134*  K  --  3.0*   < > 4.5 3.1* 2.7*  CL  --  93*   < > 98* 98* 100*  CO2  --  21*   < > 18* 20* 23  GLUCOSE  --  63*   < > 106* 113* 94  BUN  --  7   < > 7 6 8   CREATININE  --  0.68   < > 0.68 0.68 0.71  CALCIUM  --  8.8*   < > 8.0* 8.3* 8.6*  MG 1.5* 1.8  --   --  1.9 2.0  PHOS 3.3 2.2*  --   --   --  2.6   < > = values in this interval not displayed.   GFR: Estimated Creatinine  Clearance: 58.4 mL/min (by C-G formula based on SCr of 0.71 mg/dL).  Liver Function Tests: Recent Labs  Lab 04/30/17 1428 05/01/17 0422 05/03/17 0338  AST 65* 76* 30  ALT 25 27 26   ALKPHOS 61 57 52  BILITOT 1.3* 1.5* 1.1  PROT 7.9 7.2 6.0*  ALBUMIN 4.7 4.1 3.4*    Cardiac Enzymes: Recent Labs  Lab 04/30/17 1950  CKTOTAL 2,037*    HbA1C: Hgb A1c MFr Bld  Date/Time Value Ref Range Status  09/10/2010 09:52 AM 5.4 <5.7 % Final    Comment:    (NOTE)                                                                       According to the ADA Clinical Practice Recommendations for 2011, when HbA1c is used as a screening test:  >=6.5%   Diagnostic of Diabetes Mellitus           (if abnormal result is confirmed) 5.7-6.4%   Increased risk of developing Diabetes Mellitus References:Diagnosis and Classification of Diabetes Mellitus,Diabetes Care,2011,34(Suppl 1):S62-S69 and Standards of Medical Care in         Diabetes - 2011,Diabetes Care,2011,34 (Suppl 1):S11-S61.    CBG: Recent Labs  Lab 04/30/17 1440  GLUCAP 133*    Scheduled Meds: . cholecalciferol  4,000 Units Oral Daily  . cyanocobalamin  500 mcg Oral Daily  . escitalopram  10 mg Oral QPC breakfast  . feeding supplement (ENSURE ENLIVE)  237 mL Oral BID BM  . folic acid  1 mg Oral Daily  . heparin injection (subcutaneous)  5,000 Units Subcutaneous Q8H  . losartan  25 mg Oral Daily  . metoprolol succinate  50 mg Oral QPM  . mometasone-formoterol  2 puff Inhalation BID  . potassium chloride  40 mEq Oral BID  . thiamine  100 mg Oral Daily     LOS: 3 days   Hartley Barefoot MD [facility] [phone]  If 7PM-7AM, please contact night-coverage [email or web address] Password [facility] 05/03/2017, 1:29 PM

## 2017-05-03 NOTE — Progress Notes (Signed)
Results for Courtney Miles, [name] (MRN [id number]) as of 05/03/2017 05:10  Ref. Range 05/03/2017 03:38  Potassium Latest Ref Range: 3.5 - 5.1 mmol/L 2.7 (LL)   Order received [id number]

## 2017-05-03 NOTE — Progress Notes (Signed)
Physical Therapy Treatment Patient Details Name: Courtney LimingCecilia M Rightmyer MRN: [id number] DOB: 10/02/1955 Today's Date: 05/03/2017    History of Present Illness Pt. is a 62 y.o. F with significant PMH of hypertension, depression, prior episode of seizure and heart murmur. Patient admitted following seizure and postictal episode with accompanying 2 days of nausea, vomiting and nonbloody diarrhea. Of note, patient consumes 2 tequila shots per day.        PT Comments    Patient is progressing very well towards their physical therapy goals. Patient with significantly improved balance compared to yesterday, however, still requires min guard for dynamic gait with head turns due to mild lateral sway and difficulty with dual tasking. Also intermittently uses wall rail for additional assistance. Patient reports she feels more alert today; cognition appears to be close to baseline but need confirmation from family member.    Follow Up Recommendations  Home health PT;Supervision for mobility/OOB     Equipment Recommendations  None recommended by PT    Recommendations for Other Services       Precautions / Restrictions Precautions Precautions: Fall Restrictions Weight Bearing Restrictions: No    Mobility  Bed Mobility Overal bed mobility: Modified Independent             General bed mobility comments: Modified independent with supine to sit  Transfers Overall transfer level: Modified independent Equipment used: None Transfers: Sit to/from Stand Sit to Stand: Modified independent (Device/Increase time)         General transfer comment: Supervision for safety with transfers.  Ambulation/Gait Ambulation/Gait assistance: Min guard Ambulation Distance (Feet): 350 Feet Assistive device: None Gait Pattern/deviations: Narrow base of support   Gait velocity interpretation: Below normal speed for age/gender General Gait Details: Patient ambulating intermittently with use of wall rail and  mild lateral sway however no overt LOB.    Stairs            Wheelchair Mobility    Modified Rankin (Stroke Patients Only)       Balance Overall balance assessment: Needs assistance Sitting-balance support: No upper extremity supported;Feet unsupported Sitting balance-Leahy Scale: Good     Standing balance support: No upper extremity supported Standing balance-Leahy Scale: Fair   Single Leg Stance - Right Leg: 5 seconds Single Leg Stance - Left Leg: 5 seconds                        Cognition Arousal/Alertness: Awake/alert Behavior During Therapy: WFL for tasks assessed/performed Overall Cognitive Status: Impaired/Different from baseline Area of Impairment: Safety/judgement;Awareness;Attention                   Current Attention Level: Sustained Memory: Decreased short-term memory   Safety/Judgement: Decreased awareness of safety;Decreased awareness of deficits Awareness: Emergent Problem Solving: Decreased initiation;Requires verbal cues General Comments: Pt easily distractable and difficulty maintaining attention on task at hand; however, patient may be close to baseline.       Exercises      General Comments General comments (skin integrity, edema, etc.): Husband present throughout session      Pertinent Vitals/Pain Pain Assessment: Faces Faces Pain Scale: Hurts a little bit Pain Location: IV site Pain Descriptors / Indicators: Discomfort Pain Intervention(s): Monitored during session    Home Living Family/patient expects to be discharged to:: Private residence Living Arrangements: Spouse/significant other Available Help at Discharge: Family Type of Home: House Home Access: Stairs to enter Entrance Stairs-Rails: Can reach both Home Layout: Multi-level Home Equipment: None  Prior Function Level of Independence: Independent      Comments: Patient works as a Education administrator and enjoys taking her dog for walks around her garden.    PT Goals (current goals can now be found in the care plan section) Acute Rehab PT Goals Patient Stated Goal: Go home to "[facility]" her dog PT Goal Formulation: With patient Time For Goal Achievement: 05/16/17 Potential to Achieve Goals: Good Progress towards PT goals: Progressing toward goals    Frequency    Min 4X/week      PT Plan Current plan remains appropriate    Co-evaluation              AM-PAC PT "6 Clicks" Daily Activity  Outcome Measure  Difficulty turning over in bed (including adjusting bedclothes, sheets and blankets)?: None Difficulty moving from lying on back to sitting on the side of the bed? : None Difficulty sitting down on and standing up from a chair with arms (e.g., wheelchair, bedside commode, etc,.)?: None Help needed moving to and from a bed to chair (including a wheelchair)?: A Little Help needed walking in hospital room?: A Little Help needed climbing 3-5 steps with a railing? : A Little 6 Click Score: 21    End of Session Equipment Utilized During Treatment: Gait belt Activity Tolerance: Patient tolerated treatment well Patient left: in bed;with bed alarm set Nurse Communication: Mobility status PT Visit Diagnosis: Unsteadiness on feet (R26.81);Difficulty in walking, not elsewhere classified (R26.2)     Time: 1110-1131 PT Time Calculation (min) (ACUTE ONLY): 21 min  Charges:  $Therapeutic Activity: 8-22 mins                    G Codes:       Laurina Bustle, PT, DPT Acute Rehabilitation Services  Pager: [phone]    Vanetta Mulders 05/03/2017, 12:07 PM

## 2017-05-04 DIAGNOSIS — R4182 Altered mental status, unspecified: Secondary | ICD-10-CM

## 2017-05-04 LAB — BASIC METABOLIC PANEL
ANION GAP: 9 (ref 5–15)
BUN: 11 mg/dL (ref 6–20)
CHLORIDE: 104 mmol/L (ref 101–111)
CO2: 24 mmol/L (ref 22–32)
Calcium: 9.1 mg/dL (ref 8.9–10.3)
Creatinine, Ser: 0.69 mg/dL (ref 0.44–1.00)
GFR calc non Af Amer: >60 mL/min (ref >60)
GLUCOSE: 102 mg/dL — AB (ref 65–99)
POTASSIUM: 4.7 mmol/L (ref 3.5–5.1)
Sodium: 137 mmol/L (ref 135–145)

## 2017-05-04 MED ORDER — THIAMINE HCL 100 MG PO TABS: 100.0000 mg | ORAL_TABLET | Freq: Every day | ORAL | 0 refills | Status: DC

## 2017-05-04 MED ORDER — LOSARTAN POTASSIUM 25 MG PO TABS: 25.0000 mg | ORAL_TABLET | Freq: Every day | ORAL | 0 refills | Status: DC

## 2017-05-04 MED ORDER — FOLIC ACID 1 MG PO TABS: 1.0000 mg | ORAL_TABLET | Freq: Every day | ORAL | 0 refills | Status: DC

## 2017-05-04 MED ORDER — VALPROIC ACID 250 MG PO CAPS: 500.0000 mg | ORAL_CAPSULE | Freq: Two times a day (BID) | ORAL | 1 refills | Status: DC

## 2017-05-04 NOTE — Care Management Note (Signed)
Case Management Note  Patient Details  Name: Courtney Miles MRN: [id number] Date of Birth: 01/11/1956  Subjective/Objective:   Pt admitted with HTN. She is from home with her spouse.                 Action/Plan: PT with no f/u and OT with HH rec. Pt does not want HH services. Also recommendation for 3 in 1. Pt states her bathroom is handicap equipped and she does not need the 3 in 1.  PCP: Dr Orie RoutWendy McNeal Spouse to provide transportation home.   Expected Discharge Date:  05/04/17               Expected Discharge Plan:  Home/Self Care  In-House Referral:     Discharge planning Services  CM Consult  Post Acute Care Choice:    Choice offered to:     DME Arranged:    DME Agency:     HH Arranged:    HH Agency:     Status of Service:  Completed, signed off  If discussed at [employer] Length of Stay Meetings, dates discussed:    Additional Comments:  Kermit BaloKelli F Derrion Tritz, RN 05/04/2017, 1:15 PM

## 2017-05-04 NOTE — Progress Notes (Signed)
Pt being discharged from hospital per orders from MD. Pt educated on discharge instructions. Pt verbalized understanding of instructions. All questions and concerns were addressed. Pt's IV was removed prior to discharge. Pt exited hospital via wheelchair accompanied by staff. 

## 2017-05-04 NOTE — Discharge Summary (Signed)
Physician Discharge Summary  Courtney Miles:[id number] DOB: [birth year] DOA: 04/30/2017  PCP: System, Provider Not In  Admit date: 04/30/2017 Discharge date: 05/04/2017  Admitted From: Home  Disposition: Home   Recommendations for Outpatient Follow-up:  1. Follow up with PCP in 1-2 weeks 2. Please obtain BMP/CBC in one week 3. Continue counseling regarding alcohol cessation.  4. Follow up for aortic valve stenosis.   Home Health: decline  Discharge Condition; stable.  CODE STATUS: full code.  Diet recommendation: Heart Healthy    Brief/Interim Summary:  Brief Narrative:  62 y.o.F w/ a Hx of Depression,Seizure (secondary to illness),Polysubstance abuse, EtOH abuse, HTN,and a Heart murmur who presented with2 daysof nausea, vomiting, and diarrhea.  Over the course of her illness the patient became more lethargic, and then had a witnessed seizure with tongue biting. EMS administered 5 mg of Versed and she was taken to the ED.    Husband stated she has been drinking at least 2 shots of tequilla a day for at least 1 month.  Subjective: Patient is alert, no new complaints. Family at bedside, who think patient is at baseline.  Assessment & Plan:  Seizure likely due to EtOH withdrawal in setting of gastrointestinal illness > intractable vomiting - patient was started on valproic acid. She has been tolerating medications.  She needs to follow up with neurology put-patient    Nausea and vomiting Viral GI illness v/s  EtOH associated gastritis - CT abdomen negative for acute etiology - appears rehydrated at this time - stop IVF. resolved   Hypokalemia Replaced.   Abnormal EKG / Prolonged QT interval EKG unchanged from prior presentation - continue telemetry - QT decreased   HTN Continue with metoprolol/ cozaar  Cardiac murmur chronic  Moderate Aortic stenosis, mild regurgitation. Will need follow up ECHO and cardiology follow up.   EtOH abuse On  CIWA> no whidrawal system currently    Discharge Diagnoses:  Active Problems:   Seizure (HCC)   Abnormal EKG   HTN (hypertension)   Hyponatremia   Nausea & vomiting   Dehydration   Leukocytosis   Hypokalemia   Prolonged QT interval   Post-ictal state [facility])    Discharge Instructions  Discharge Instructions    Diet - low sodium heart healthy   Complete by:  As directed    Increase activity slowly   Complete by:  As directed      Allergies as of 05/04/2017      Reactions   Sulfa Antibiotics Other (See Comments)   Turned bright red      Medication List    STOP taking these medications   promethazine 25 MG tablet Commonly known as:  PHENERGAN     TAKE these medications   DULERA 100-5 MCG/ACT Aero Generic drug:  mometasone-formoterol Take 2 puffs by mouth 2 (two) times daily.   escitalopram 10 MG tablet Commonly known as:  LEXAPRO Take 10 mg by mouth daily after breakfast.   folic acid 1 MG tablet Commonly known as:  FOLVITE Take 1 tablet (1 mg total) by mouth daily. Start taking on:  05/05/2017   losartan 25 MG tablet Commonly known as:  COZAAR Take 1 tablet (25 mg total) by mouth daily. Start taking on:  05/05/2017 What changed:    medication strength  how much to take   metoprolol succinate 50 MG 24 hr tablet Commonly known as:  TOPROL-XL Take 50 mg by mouth every evening. Take with or immediately following a meal.   pantoprazole  40 MG tablet Commonly known as:  PROTONIX Take 40 mg by mouth daily.   sucralfate 1 g tablet Commonly known as:  CARAFATE Take 1 g by mouth 2 (two) times daily.   thiamine 100 MG tablet Take 1 tablet (100 mg total) by mouth daily. Start taking on:  05/05/2017   valproic acid 250 MG capsule Commonly known as:  DEPAKENE Take 2 capsules (500 mg total) by mouth 2 (two) times daily.   vitamin B-12 500 MCG tablet Commonly known as:  CYANOCOBALAMIN Take 500 mcg by mouth daily.   Vitamin D 2000 units tablet Take  4,000 Units by mouth daily.       Allergies  Allergen Reactions  . Sulfa Antibiotics Other (See Comments)    Turned bright red     Consultations:  Neurology    Procedures/Studies: Ct Head Wo Contrast  Result Date: 04/30/2017 CLINICAL DATA:  Seizure.  Acute mental status change. EXAM: CT HEAD WITHOUT CONTRAST TECHNIQUE: Contiguous axial images were obtained from the base of the skull through the vertex without intravenous contrast. COMPARISON:  March 31, 2012 FINDINGS: Brain: No evidence of acute infarction, hemorrhage, hydrocephalus, extra-axial collection or mass lesion/mass effect. Vascular: No hyperdense vessel or unexpected calcification. Skull: Normal. Negative for fracture or focal lesion. Sinuses/Orbits: No acute finding. Other: None. IMPRESSION: No cause for seizure identified.  No acute abnormality. Electronically Signed   By: Gerome Sam III M.D   On: 04/30/2017 17:25   Mr Brain W Wo Contrast  Result Date: 05/01/2017 CLINICAL DATA:  Altered mental status and seizure EXAM: MRI HEAD WITHOUT AND WITH CONTRAST TECHNIQUE: Multiplanar, multiecho pulse sequences of the brain and surrounding structures were obtained without and with intravenous contrast. CONTRAST:  12mL MULTIHANCE GADOBENATE DIMEGLUMINE 529 MG/ML IV SOLN COMPARISON:  Head CT 04/30/2017 Brain MRI 04/01/2012 FINDINGS: Brain: The midline structures are normal. There is no acute infarct or acute hemorrhage. No mass lesion, hydrocephalus, dural abnormality or extra-axial collection. Minimal white matter hyperintensity, nonspecific and commonly seen in asymptomatic patients of this age. No age-advanced or lobar predominant atrophy. No chronic microhemorrhage or superficial siderosis. Vascular: Major intracranial arterial and venous sinus flow voids are preserved. Skull and upper cervical spine: The visualized skull base, calvarium, upper cervical spine and extracranial soft tissues are normal. Sinuses/Orbits: Small amount  of left mastoid fluid.  Normal orbits. IMPRESSION: No acute abnormality. Minimal white matter hyperintensity, which is nonspecific but may indicate mild chronic small vessel disease. Electronically Signed   By: Deatra Robinson M.D.   On: 05/01/2017 06:56   Ct Abdomen Pelvis W Contrast  Result Date: 04/30/2017 CLINICAL DATA:  Abdominal pain with nausea and vomiting EXAM: CT ABDOMEN AND PELVIS WITH CONTRAST TECHNIQUE: Multidetector CT imaging of the abdomen and pelvis was performed using the standard protocol following bolus administration of intravenous contrast. CONTRAST:  ISOVUE-300 IOPAMIDOL (ISOVUE-300) INJECTION 61% COMPARISON:  04/02/2012 FINDINGS: Lower chest: Lung bases are free of acute infiltrate or sizable effusion. Vague ground-glass density is noted in the left lower lobe in the subpleural location measuring approximately 6 mm in greatest dimension. This is likely postinflammatory in nature. Hepatobiliary: Mild fatty infiltration of the liver is noted. Gallbladder is within normal limits. Pancreas: Unremarkable. No pancreatic ductal dilatation or surrounding inflammatory changes. Spleen: Normal in size without focal abnormality. Adrenals/Urinary Tract: Adrenal glands are within normal limits. Kidneys are well visualized bilaterally without obstructive change. Nonobstructing stone is noted in the midportion of the left kidney measuring 6 mm. Bladder is partially decompressed.  Stomach/Bowel: The appendix is within normal limits. Transient small bowel intussusception is again noted similar to that seen in 2014. No obstructive changes are seen. No inflammatory changes are noted. Vascular/Lymphatic: Aortic atherosclerosis. No enlarged abdominal or pelvic lymph nodes. Reproductive: Fibroid uterus is noted.  No adnexal mass is seen. Other: No abdominal wall hernia or abnormality. No abdominopelvic ascites. Musculoskeletal: Degenerative changes of the lumbar spine are noted. IMPRESSION: Nonobstructing  left renal stone. Transient small bowel intussusception in the left mid abdomen without obstructive change. 6 mm ground-glass density in the left lower lobe likely postinflammatory in nature. Initial follow-up with CT at 6-12 months is recommended to confirm persistence. If persistent, repeat CT is recommended every 2 years until 5 years of stability has been established. This recommendation follows the consensus statement: Guidelines for Management of Incidental Pulmonary Nodules Detected on CT Images: From the Fleischner Society 2017; Radiology 2017; 284:228-243. Stable fibroid uterus. Electronically Signed   By: Alcide CleverMark  Lukens M.D.   On: 04/30/2017 17:30      Subjective: Feeling well, denies pain   Discharge Exam: Vitals:   05/04/17 0805 05/04/17 1141  BP: 114/70 (P) 101/67  Pulse: 73 (P) 76  Resp: 18 (P) 18  Temp: 98.4 F (36.9 C) (P) 98.2 F (36.8 C)  SpO2: 100% (P) 99%   Vitals:   05/03/17 2332 05/04/17 0425 05/04/17 0805 05/04/17 1141  BP: (!) 142/48 107/69 114/70 (P) 101/67  Pulse: 72 70 73 (P) 76  Resp: 18 18 18  (P) 18  Temp: 98.1 F (36.7 C) 98.2 F (36.8 C) 98.4 F (36.9 C) (P) 98.2 F (36.8 C)  TempSrc: Oral Oral Oral (P) Oral  SpO2: 100% 100% 100% (P) 99%  Weight:      Height:        General: Pt is alert, awake, not in acute distress Cardiovascular: RRR, S1/S2 +, no rubs, no gallops Respiratory: CTA bilaterally, no wheezing, no rhonchi Abdominal: Soft, NT, ND, bowel sounds + Extremities: no edema, no cyanosis    The results of significant diagnostics from this hospitalization (including imaging, microbiology, ancillary and laboratory) are listed below for reference.     Microbiology: No results found for this or any previous visit (from the past 240 hour(s)).   Labs: BNP (last 3 results) No results for input(s): BNP in the last 8760 hours. Basic Metabolic Panel: Recent Labs  Lab 04/30/17 1950 05/01/17 0422  05/01/17 2120 05/02/17 0151 05/02/17 0615  05/03/17 0338 05/04/17 0746  NA  --  131*   < > 130* 129* 131* 134* 137  K  --  3.0*   < > 2.9* 4.5 3.1* 2.7* 4.7  CL  --  93*   < > 96* 98* 98* 100* 104  CO2  --  21*   < > 22 18* 20* 23 24  GLUCOSE  --  63*   < > 113* 106* 113* 94 102*  BUN  --  7   < > 6 7 6 8 11   CREATININE  --  0.68   < > 0.78 0.68 0.68 0.71 0.69  CALCIUM  --  8.8*   < > 8.6* 8.0* 8.3* 8.6* 9.1  MG 1.5* 1.8  --   --   --  1.9 2.0  --   PHOS 3.3 2.2*  --   --   --   --  2.6  --    < > = values in this interval not displayed.   Liver Function Tests: Recent Labs  Lab 04/30/17 1428 05/01/17 0422 05/03/17 0338  AST 65* 76* 30  ALT 25 27 26   ALKPHOS 61 57 52  BILITOT 1.3* 1.5* 1.1  PROT 7.9 7.2 6.0*  ALBUMIN 4.7 4.1 3.4*   No results for input(s): LIPASE, AMYLASE in the last 168 hours. No results for input(s): AMMONIA in the last 168 hours. CBC: Recent Labs  Lab 04/30/17 1428 05/01/17 0422 05/02/17 0615 05/03/17 0338  WBC 17.4* 13.8* 9.0 7.6  HGB 14.6 15.0 14.0 13.8  HCT 41.5 44.2 41.5 40.9  MCV 86.1 88.8 87.0 88.0  PLT 273 270 256 257   Cardiac Enzymes: Recent Labs  Lab 04/30/17 1950  CKTOTAL 2,037*   BNP: Invalid input(s): POCBNP CBG: Recent Labs  Lab 04/30/17 1440  GLUCAP 133*   D-Dimer No results for input(s): DDIMER in the last 72 hours. Hgb A1c No results for input(s): HGBA1C in the last 72 hours. Lipid Profile No results for input(s): CHOL, HDL, LDLCALC, TRIG, CHOLHDL, LDLDIRECT in the last 72 hours. Thyroid function studies No results for input(s): TSH, T4TOTAL, T3FREE, THYROIDAB in the last 72 hours.  Invalid input(s): FREET3 Anemia work up No results for input(s): VITAMINB12, FOLATE, FERRITIN, TIBC, IRON, RETICCTPCT in the last 72 hours. Urinalysis    Component Value Date/Time   COLORURINE YELLOW 04/30/2017 1637   APPEARANCEUR CLEAR 04/30/2017 1637   LABSPEC 1.020 04/30/2017 1637   PHURINE 6.0 04/30/2017 1637   GLUCOSEU NEGATIVE 04/30/2017 1637   HGBUR LARGE (A)  04/30/2017 1637   BILIRUBINUR NEGATIVE 04/30/2017 1637   KETONESUR 20 (A) 04/30/2017 1637   PROTEINUR 100 (A) 04/30/2017 1637   UROBILINOGEN 0.2 09/10/2010 0951   NITRITE NEGATIVE 04/30/2017 1637   LEUKOCYTESUR NEGATIVE 04/30/2017 1637   Sepsis Labs Invalid input(s): PROCALCITONIN,  WBC,  LACTICIDVEN Microbiology No results found for this or any previous visit (from the past 240 hour(s)).   Time coordinating discharge: Over 30 minutes  SIGNED:   Alba Cory, MD  [facility] 05/04/2017, 12:22 PM Pager   If 7PM-7AM, please contact night-coverage [email or web address] Password [email or web address]

## 2017-05-04 NOTE — Progress Notes (Signed)
Physical Therapy Treatment Patient Details Name: Courtney Miles MRN: [id number] DOB: August 24, 1955 Today's Date: 05/04/2017    History of Present Illness Pt. is a 62 y.o. F with significant PMH of hypertension, depression, prior episode of seizure and heart murmur. Patient admitted following seizure and postictal episode with accompanying 2 days of nausea, vomiting and nonbloody diarrhea. Of note, patient consumes 2 tequila shots per day.        PT Comments    During her stay, patient met physical therapy goals for safe discharge home. Currently ambulating over level surfaces at a modified independent level with no AD and requiring supervision for high level balance activities. Continues with inattention, but believe this is baseline. No further acute care PT or follow up PT needs at this time. Please re-consult if status changes.     Follow Up Recommendations  No PT follow up     Equipment Recommendations  None recommended by PT    Recommendations for Other Services       Precautions / Restrictions Precautions Precautions: Fall Restrictions Weight Bearing Restrictions: No    Mobility  Bed Mobility Overal bed mobility: Independent                Transfers Overall transfer level: Independent Equipment used: None Transfers: Sit to/from Stand Sit to Stand: Independent            Ambulation/Gait Ambulation/Gait assistance: Modified independent (Device/Increase time) Ambulation Distance (Feet): 350 Feet Assistive device: None Gait Pattern/deviations: Narrow base of support   Gait velocity interpretation: Below normal speed for age/gender General Gait Details: Patient ambulating occasionally with use of wall rail but does not demonstrate apparent balance deficits with walking over level surfaces. requiring supervision for high level balance activities such as side stepping, backwards walking, and dynamic yoga "warrior poses," in split lunge position with shoulder  flexion.   Stairs            Wheelchair Mobility    Modified Rankin (Stroke Patients Only)       Balance Overall balance assessment: Needs assistance Sitting-balance support: No upper extremity supported;Feet unsupported Sitting balance-Leahy Scale: Good     Standing balance support: No upper extremity supported Standing balance-Leahy Scale: Good Standing balance comment: Supervision for dynamic balance                            Cognition Arousal/Alertness: Awake/alert Behavior During Therapy: WFL for tasks assessed/performed Overall Cognitive Status: Impaired/Different from baseline Area of Impairment: Awareness;Attention                   Current Attention Level: Sustained       Awareness: Emergent   General Comments: Pt easily distractable and difficulty maintaining attention on task at hand; however, believe patient is close to baseline.       Exercises      General Comments General comments (skin integrity, edema, etc.): Patient states she feels easily fatigued, however, able to participate fully in therapy.      Pertinent Vitals/Pain Pain Assessment: No/denies pain    Home Living                      Prior Function            PT Goals (current goals can now be found in the care plan section) Acute Rehab PT Goals Patient Stated Goal: Go home to "[facility]" her dog PT Goal Formulation: With  patient Time For Goal Achievement: 05/16/17 Potential to Achieve Goals: Good Progress towards PT goals: Goals met/education completed, patient discharged from PT    Frequency    Min 4X/week      PT Plan Current plan remains appropriate    Co-evaluation              AM-PAC PT "6 Clicks" Daily Activity  Outcome Measure  Difficulty turning over in bed (including adjusting bedclothes, sheets and blankets)?: None Difficulty moving from lying on back to sitting on the side of the bed? : None Difficulty sitting down on  and standing up from a chair with arms (e.g., wheelchair, bedside commode, etc,.)?: None Help needed moving to and from a bed to chair (including a wheelchair)?: None Help needed walking in hospital room?: None Help needed climbing 3-5 steps with a railing? : A Little 6 Click Score: 23    End of Session Equipment Utilized During Treatment: Gait belt Activity Tolerance: Patient tolerated treatment well Patient left: in chair;with call bell/phone within reach;with chair alarm set Nurse Communication: Mobility status PT Visit Diagnosis: Unsteadiness on feet (R26.81);Difficulty in walking, not elsewhere classified (R26.2)     Time: 5183-3582 PT Time Calculation (min) (ACUTE ONLY): 24 min  Charges:  $Gait Training: 8-22 mins $Therapeutic Activity: 8-22 mins                    G Codes:       Ellamae Sia, PT, DPT Acute Rehabilitation Services  Pager: [phone]    Willy Eddy 05/04/2017, 11:05 AM

## 2017-05-04 NOTE — Discharge Instructions (Signed)
[place] [facility] [place] statutes, patients with seizures are not allowed to drive until they have been seizure-free for six months.  Use caution when using heavy equipment or power tools. Avoid working on ladders or at heights. Take showers instead of baths. Ensure the water temperature is not too high on the home water heater. Do not go swimming alone. Do not lock yourself in a room alone (i.e. bathroom). When caring for infants or small children, sit down when holding, feeding, or changing them to minimize risk of injury to the child in the event you have a seizure. Maintain good sleep hygiene. Avoid alcohol.  If patienthas another seizure, call 911 and bring them back to the ED if       Epilepsy Epilepsy is a condition in which a person has repeated seizures over time. A seizure is a sudden burst of abnormal electrical and chemical activity in the brain. Seizures can cause a change in attention, behavior, or the ability to remain awake and alert (altered mental status). Epilepsy increases a person's risk of falls, accidents, and injury. It can also lead to complications, including:  Depression.  Poor memory.  Sudden unexplained death in epilepsy (SUDEP). This complication is rare, and its cause is not known.  Most people with epilepsy lead normal lives. What are the causes? This condition may be caused by:  A head injury.  An injury that happens at birth.  A high fever during childhood.  A stroke.  Bleeding that goes into or around the brain.  Certain medicines and drugs.  Having too little oxygen for a long period of time.  Abnormal brain development.  Certain infections, such as meningitis and encephalitis.  Brain tumors.  Conditions that are passed along from parent to child (are hereditary).  What are the signs or symptoms? Symptoms of a seizure vary greatly from person to person. They include:  Convulsions.  Stiffening of the body.  Involuntary movements  of the arms or legs.  Loss of consciousness.  Breathing problems.  Falling suddenly.  Confusion.  Head nodding.  Eye blinking or fluttering.  Lip smacking.  Drooling.  Rapid eye movements.  Grunting.  Loss of bladder control and bowel control.  Staring.  Unresponsiveness.  Some people have symptoms right before a seizure happens (aura) and right after a seizure happens. Symptoms of an aura include:  Fear or anxiety.  Nausea.  Feeling like the room is spinning (vertigo).  A feeling of having seen or heard something before (deja vu).  Odd tastes or smells.  Changes in vision, such as seeing flashing lights or spots.  Symptoms that follow a seizure include:  Confusion.  Sleepiness.  Headache.  How is this diagnosed? This condition is diagnosed based on:  Your symptoms.  Your medical history.  A physical exam.  A neurological exam. A neurological exam is similar to a physical exam. It involves checking your strength, reflexes, coordination, and sensations.  Tests, such as: ? An electroencephalogram (EEG). This is a painless test that creates a diagram of your brain waves. ? An MRI of the brain. ? A CT scan of the brain. ? A lumbar puncture, also called a spinal tap. ? Blood tests to check for signs of infection or abnormal blood chemistry.  How is this treated? There is no cure for this condition, but treatment can help control seizures. Treatment may involve:  Taking medicines to control seizures. These include medicines to prevent seizures and medicines to stop seizures as they  occur.  Having a device called a vagus nerve stimulator implanted in the chest. The device sends electrical impulses to the vagus nerve and to the brain to prevent seizures. This treatment may be recommended if medicines do not help.  Brain surgery. There are several kinds of surgeries that may be done to stop seizures from happening or to reduce how often seizures  happen.  Having regular blood tests. You may need to have blood tests regularly to check that you are getting the right amount of medicine.  Once this condition has been diagnosed, it is important to begin treatment as soon as possible. For some people, epilepsy eventually goes away. Follow these instructions at home: Medicines   Take over-the-counter and prescription medicines only as told by your health care provider.  Avoid any substances that may prevent your medicine from working properly, such as alcohol. Activity  Get enough rest. Lack of sleep can make seizures more likely to occur.  Follow instructions from your health care provider about driving, swimming, and doing any other activities that would be dangerous if you had a seizure. Educating others Teach friends and family what to do if you have a seizure. They should:  Lay you on the ground to prevent a fall.  Cushion your head and body.  Loosen any tight clothing around your neck.  Turn you on your side. If vomiting occurs, this helps keep your airway clear.  Stay with you until you recover.  Not hold you down. Holding you down will not stop the seizure.  Not put anything in your mouth.  Know whether or not you need emergency care.  General instructions  Avoid anything that has ever triggered a seizure for you.  Keep a seizure diary. Record what you remember about each seizure, especially anything that might have triggered the seizure.  Keep all follow-up visits as told by your health care provider. This is important. Contact a health care provider if:  Your seizure pattern changes.  You have symptoms of infection or another illness. This might increase your risk of having a seizure. Get help right away if:  You have a seizure that does not stop after 5 minutes.  You have several seizures in a row without a complete recovery in between seizures.  You have a seizure that makes it harder to  breathe.  You have a seizure that is different from previous seizures.  You have a seizure that leaves you unable to speak or use a part of your body.  You did not wake up immediately after a seizure. This information is not intended to replace advice given to you by your health care provider. Make sure you discuss any questions you have with your health care provider. Document Released: 01/25/2005 Document Revised: 08/23/2015 Document Reviewed: 08/05/2015 Elsevier Interactive Patient Education  [employer].

## 2017-09-13 ENCOUNTER — Encounter (HOSPITAL_COMMUNITY): Payer: Self-pay

## 2017-09-13 ENCOUNTER — Emergency Department (HOSPITAL_COMMUNITY): Payer: BLUE CROSS/BLUE SHIELD

## 2017-09-13 ENCOUNTER — Other Ambulatory Visit: Payer: Self-pay

## 2017-09-13 ENCOUNTER — Inpatient Hospital Stay (HOSPITAL_COMMUNITY)
Admission: EM | Admit: 2017-09-13 | Discharge: 2017-09-14 | DRG: 391 | Disposition: A | Discharge status: Home / Self Care | Payer: BLUE CROSS/BLUE SHIELD | Attending: Internal Medicine | Admitting: Internal Medicine

## 2017-09-13 DIAGNOSIS — I110 Hypertensive heart disease with heart failure: Secondary | ICD-10-CM | POA: Diagnosis present

## 2017-09-13 DIAGNOSIS — G40909 Epilepsy, unspecified, not intractable, without status epilepticus: Secondary | ICD-10-CM | POA: Diagnosis present

## 2017-09-13 DIAGNOSIS — E871 Hypo-osmolality and hyponatremia: Secondary | ICD-10-CM | POA: Diagnosis present

## 2017-09-13 DIAGNOSIS — Z87891 Personal history of nicotine dependence: Secondary | ICD-10-CM | POA: Diagnosis not present

## 2017-09-13 DIAGNOSIS — Z9114 Patient's other noncompliance with medication regimen: Secondary | ICD-10-CM | POA: Diagnosis not present

## 2017-09-13 DIAGNOSIS — I10 Essential (primary) hypertension: Secondary | ICD-10-CM | POA: Diagnosis present

## 2017-09-13 DIAGNOSIS — E663 Overweight: Secondary | ICD-10-CM | POA: Diagnosis present

## 2017-09-13 DIAGNOSIS — Z882 Allergy status to sulfonamides status: Secondary | ICD-10-CM | POA: Diagnosis not present

## 2017-09-13 DIAGNOSIS — I4581 Long QT syndrome: Secondary | ICD-10-CM | POA: Diagnosis present

## 2017-09-13 DIAGNOSIS — D72825 Bandemia: Secondary | ICD-10-CM

## 2017-09-13 DIAGNOSIS — I5032 Chronic diastolic (congestive) heart failure: Secondary | ICD-10-CM | POA: Diagnosis present

## 2017-09-13 DIAGNOSIS — F32A Depression, unspecified: Secondary | ICD-10-CM | POA: Diagnosis present

## 2017-09-13 DIAGNOSIS — G9341 Metabolic encephalopathy: Secondary | ICD-10-CM | POA: Diagnosis present

## 2017-09-13 DIAGNOSIS — Z79899 Other long term (current) drug therapy: Secondary | ICD-10-CM | POA: Diagnosis not present

## 2017-09-13 DIAGNOSIS — K529 Noninfective gastroenteritis and colitis, unspecified: Secondary | ICD-10-CM | POA: Diagnosis present

## 2017-09-13 DIAGNOSIS — F329 Major depressive disorder, single episode, unspecified: Secondary | ICD-10-CM | POA: Diagnosis present

## 2017-09-13 DIAGNOSIS — G9340 Encephalopathy, unspecified: Secondary | ICD-10-CM

## 2017-09-13 DIAGNOSIS — E860 Dehydration: Secondary | ICD-10-CM | POA: Diagnosis present

## 2017-09-13 DIAGNOSIS — D72829 Elevated white blood cell count, unspecified: Secondary | ICD-10-CM | POA: Diagnosis present

## 2017-09-13 DIAGNOSIS — Z6828 Body mass index (BMI) 28.0-28.9, adult: Secondary | ICD-10-CM

## 2017-09-13 LAB — BASIC METABOLIC PANEL
ANION GAP: 16 — AB (ref 5–15)
BUN: 12 mg/dL (ref 8–23)
CHLORIDE: 86 mmol/L — AB (ref 98–111)
CO2: 20 mmol/L — AB (ref 22–32)
CREATININE: 0.73 mg/dL (ref 0.44–1.00)
Calcium: 8.6 mg/dL — ABNORMAL LOW (ref 8.9–10.3)
GFR calc non Af Amer: >60 mL/min (ref >60)
Glucose, Bld: 101 mg/dL — ABNORMAL HIGH (ref 70–99)
POTASSIUM: 2.7 mmol/L — AB (ref 3.5–5.1)
SODIUM: 122 mmol/L — AB (ref 135–145)

## 2017-09-13 LAB — COMPREHENSIVE METABOLIC PANEL
ALBUMIN: 4.2 g/dL (ref 3.5–5.0)
ALT: 29 U/L (ref 0–44)
AST: 39 U/L (ref 15–41)
Alkaline Phosphatase: 66 U/L (ref 38–126)
Anion gap: 18 — ABNORMAL HIGH (ref 5–15)
BUN: 20 mg/dL (ref 8–23)
CHLORIDE: 84 mmol/L — AB (ref 98–111)
CO2: 19 mmol/L — ABNORMAL LOW (ref 22–32)
Calcium: 9.7 mg/dL (ref 8.9–10.3)
Creatinine, Ser: 1.07 mg/dL — ABNORMAL HIGH (ref 0.44–1.00)
GFR calc Af Amer: >60 mL/min (ref >60)
GFR calc non Af Amer: 54 mL/min — ABNORMAL LOW (ref >60)
Glucose, Bld: 143 mg/dL — ABNORMAL HIGH (ref 70–99)
POTASSIUM: 3.2 mmol/L — AB (ref 3.5–5.1)
SODIUM: 121 mmol/L — AB (ref 135–145)
Total Bilirubin: 1.3 mg/dL — ABNORMAL HIGH (ref 0.3–1.2)
Total Protein: 7.2 g/dL (ref 6.5–8.1)

## 2017-09-13 LAB — URINALYSIS, ROUTINE W REFLEX MICROSCOPIC
BACTERIA UA: NONE SEEN
Bilirubin Urine: NEGATIVE
GLUCOSE, UA: 50 mg/dL — AB
Ketones, ur: 5 mg/dL — AB
LEUKOCYTES UA: NEGATIVE
NITRITE: NEGATIVE
PH: 6 (ref 5.0–8.0)
Specific Gravity, Urine: 1.019 (ref 1.005–1.030)

## 2017-09-13 LAB — CBC
HEMATOCRIT: 42.2 % (ref 36.0–46.0)
HEMATOCRIT: 44.3 % (ref 36.0–46.0)
HEMOGLOBIN: 14.8 g/dL (ref 12.0–15.0)
Hemoglobin: 15.9 g/dL — ABNORMAL HIGH (ref 12.0–15.0)
MCH: 29.7 pg (ref 26.0–34.0)
MCH: 29.8 pg (ref 26.0–34.0)
MCHC: 35.9 g/dL (ref 30.0–36.0)
MCHC: 35.1 g/dL (ref 30.0–36.0)
MCV: 82.8 fL (ref 78.0–100.0)
MCV: 85.1 fL (ref 78.0–100.0)
Platelets: 344 10*3/uL (ref 150–400)
Platelets: 289 10*3/uL (ref 150–400)
RBC: 5.35 MIL/uL — ABNORMAL HIGH (ref 3.87–5.11)
RBC: 4.96 MIL/uL (ref 3.87–5.11)
RDW: 12.4 % (ref 11.5–15.5)
RDW: 12.9 % (ref 11.5–15.5)
WBC: 19.5 10*3/uL — AB (ref 4.0–10.5)
WBC: 21.6 10*3/uL — AB (ref 4.0–10.5)

## 2017-09-13 LAB — I-STAT CG4 LACTIC ACID, ED
Lactic Acid, Venous: 1.78 mmol/L (ref 0.5–1.9)
Lactic Acid, Venous: 1.06 mmol/L (ref 0.5–1.9)

## 2017-09-13 LAB — AMMONIA: AMMONIA: 39 umol/L — AB (ref 9–35)

## 2017-09-13 LAB — RAPID URINE DRUG SCREEN, HOSP PERFORMED
Amphetamines: NOT DETECTED
BENZODIAZEPINES: POSITIVE — AB
Barbiturates: NOT DETECTED
COCAINE: NOT DETECTED
Opiates: NOT DETECTED
TETRAHYDROCANNABINOL: POSITIVE — AB

## 2017-09-13 LAB — CBG MONITORING, ED: GLUCOSE-CAPILLARY: 147 mg/dL — AB (ref 70–99)

## 2017-09-13 LAB — ETHANOL: Alcohol, Ethyl (B): <10 mg/dL (ref <10)

## 2017-09-13 LAB — LIPASE, BLOOD: LIPASE: 31 U/L (ref 11–51)

## 2017-09-13 MED ORDER — PIPERACILLIN-TAZOBACTAM 3.375 G IVPB
3.3750 g | Freq: Three times a day (TID) | INTRAVENOUS | Status: DC
  Administered 2017-09-13 – 2017-09-14 (×2): 3.375 g via INTRAVENOUS
  Filled 2017-09-13 (×4): qty 50

## 2017-09-13 MED ORDER — IOHEXOL 300 MG/ML  SOLN
100.0000 mL | Freq: Once | INTRAMUSCULAR | Status: AC | PRN
  Administered 2017-09-13: 100 mL via INTRAVENOUS

## 2017-09-13 MED ORDER — POTASSIUM CHLORIDE IN NACL 40-0.9 MEQ/L-% IV SOLN
INTRAVENOUS | Status: DC
  Administered 2017-09-13 – 2017-09-14 (×3): 100 mL/h via INTRAVENOUS
  Filled 2017-09-13 (×4): qty 1000

## 2017-09-13 MED ORDER — CIPROFLOXACIN IN D5W 400 MG/200ML IV SOLN
400.0000 mg | Freq: Once | INTRAVENOUS | Status: AC
  Administered 2017-09-13: 400 mg via INTRAVENOUS
  Filled 2017-09-13: qty 200

## 2017-09-13 MED ORDER — ENOXAPARIN SODIUM 40 MG/0.4ML ~~LOC~~ SOLN
40.0000 mg | SUBCUTANEOUS | Status: DC
  Administered 2017-09-13: 40 mg via SUBCUTANEOUS
  Filled 2017-09-13 (×2): qty 0.4

## 2017-09-13 MED ORDER — SODIUM CHLORIDE 0.9 % IV SOLN
INTRAVENOUS | Status: DC
  Administered 2017-09-13: 10:00:00 via INTRAVENOUS

## 2017-09-13 MED ORDER — ACETAMINOPHEN 325 MG PO TABS
650.0000 mg | ORAL_TABLET | Freq: Four times a day (QID) | ORAL | Status: DC | PRN
  Administered 2017-09-14: 650 mg via ORAL
  Filled 2017-09-13: qty 2

## 2017-09-13 MED ORDER — ACETAMINOPHEN 650 MG RE SUPP: 650.0000 mg | Freq: Four times a day (QID) | RECTAL | Status: DC | PRN

## 2017-09-13 MED ORDER — LORAZEPAM 2 MG/ML IJ SOLN
1.0000 mg | Freq: Once | INTRAMUSCULAR | Status: AC
  Administered 2017-09-13: 1 mg via INTRAVENOUS

## 2017-09-13 MED ORDER — SODIUM CHLORIDE 0.9 % IV SOLN
INTRAVENOUS | Status: DC
  Administered 2017-09-13: 06:00:00 via INTRAVENOUS

## 2017-09-13 MED ORDER — METRONIDAZOLE IN NACL 5-0.79 MG/ML-% IV SOLN
500.0000 mg | Freq: Once | INTRAVENOUS | Status: AC
  Administered 2017-09-13: 500 mg via INTRAVENOUS
  Filled 2017-09-13: qty 100

## 2017-09-13 MED ORDER — LORAZEPAM 2 MG/ML IJ SOLN
1.0000 mg | INTRAMUSCULAR | Status: DC | PRN
  Administered 2017-09-13: 1 mg via INTRAVENOUS
  Filled 2017-09-13 (×2): qty 1

## 2017-09-13 MED ORDER — SODIUM CHLORIDE 0.9 % IV BOLUS
1000.0000 mL | Freq: Once | INTRAVENOUS | Status: AC
  Administered 2017-09-13: 1000 mL via INTRAVENOUS

## 2017-09-13 MED ORDER — PANTOPRAZOLE SODIUM 40 MG PO TBEC
40.0000 mg | DELAYED_RELEASE_TABLET | Freq: Every day | ORAL | Status: DC
Start: 2017-09-13 — End: 2017-09-14
  Administered 2017-09-14: 40 mg via ORAL
  Filled 2017-09-13: qty 1

## 2017-09-13 MED ORDER — ALPRAZOLAM 0.5 MG PO TABS: 0.5000 mg | ORAL_TABLET | Freq: Three times a day (TID) | ORAL | Status: DC | PRN

## 2017-09-13 MED ORDER — LORAZEPAM 2 MG/ML IJ SOLN
1.0000 mg | Freq: Once | INTRAMUSCULAR | Status: AC
  Administered 2017-09-13: 1 mg via INTRAVENOUS
  Filled 2017-09-13: qty 1

## 2017-09-13 MED ORDER — PIPERACILLIN-TAZOBACTAM 3.375 G IVPB 30 MIN
3.3750 g | Freq: Once | INTRAVENOUS | Status: AC
  Administered 2017-09-13: 3.375 g via INTRAVENOUS
  Filled 2017-09-13: qty 50

## 2017-09-13 NOTE — ED Notes (Signed)
Pt resting with eyes closed, RR even and unlabored, NAD.  

## 2017-09-13 NOTE — ED Notes (Signed)
Patient transported to CT 

## 2017-09-13 NOTE — ED Notes (Signed)
Pt to radiology via stretcher.  

## 2017-09-13 NOTE — ED Notes (Signed)
Pt urinated in the bed before transport to observation. Pt was changed and advised to use a female urinal instead of the Purewick due to her constant movement in the bed.

## 2017-09-13 NOTE — Care Management (Signed)
This is a no charge note  Pending admission per Dr. Wilkie AyeHorton  62 year old lady with past medical history of hypertension, GERD, depression, seizure, gastro enteritis, who presents with altered mental status and agitation.  Patient also has some abdominal pain and diarrhea in the past several days.  Per ED physician, patient had altered mental status, and was suspected to have alcohol withdrawal in March.   Patient was found to have WBC 21.6, lactic acid 1.78, UDS positive for THC and benzo, negative urinalysis, sodium 121, potassium 3.2, creatinine 1.07, temperature normal, tachycardia, tachypnea, oxygen satting 90% on room air, negative chest x-ray, negative CT head for acute intracranial abnormalities.  CT abdomen/pelvis showed possible colitis.  Patient was started with the Cipro and Flagyl.  Ammonia level pending.  Patient is admitted to stepdown as inpatient.   Lorretta HarpXilin Kaesen Rodriguez, MD  [facility] Pager [phone]  If 7PM-7AM, please contact night-coverage [email or web address] Password [email or web address] 09/13/2017, 5:40 AM

## 2017-09-13 NOTE — Progress Notes (Signed)
Pharmacy Antibiotic Note  Algis LimingCecilia M Wentling is a 62 y.o. female admitted on 09/13/2017 with intra-abdominal infection.  Pharmacy has been consulted for zosyn dosing. Pt is afebrile and WBC is elevated at 19.5. SCr is WNL. CT reported with colitis.   Plan: Zosyn 3.375gm IV Q8H (4 hr inf) F/u renal fxn, C&S, clinical status and LOT *Pharmacy will sign off as no dose adjustments are anticipated. Thank you for the consult!  Weight: 140 lb (63.5 kg)  Temp (24hrs), Avg:98.4 F (36.9 C), Min:98.4 F (36.9 C), Max:98.4 F (36.9 C)  Recent Labs  Lab 09/13/17 0055 09/13/17 0203 09/13/17 0558 09/13/17 0937  WBC 21.6*  --   --  19.5*  CREATININE 1.07*  --   --  0.73  LATICACIDVEN  --  1.78 1.06  --     Estimated Creatinine Clearance: 58.3 mL/min (by C-G formula based on SCr of 0.73 mg/dL).    Allergies  Allergen Reactions  . Sulfa Antibiotics Other (See Comments)    Turned bright red    Thank you for allowing pharmacy to be a part of this patient's care.  Zemirah Krasinski, Drake LeachRachel Lynn 09/13/2017 4:19 PM

## 2017-09-13 NOTE — ED Triage Notes (Signed)
Patient here from home with new onset altered mental status.  Was normal at 2300 then began yelling and not following commands.  Patient received 5mg  Midazolam IM from EMS.  Per family had this happen before and had low potassium.

## 2017-09-13 NOTE — H&P (Addendum)
History and Physical  Courtney Miles ZOX:[id number] DOB: [birth year] DOA: 09/13/2017  Referring physician: Dr.Horton, ED MD  PCP: Gweneth Dimitri, MD  Outpatient Specialists: None Patient coming from: Home & is able to ambulate normally without assistance  Chief Complaint: Confusion   HPI: Courtney Miles is a 62 y.o. female with medical history significant for questionable seizure disorder, questionable alcohol use as well as hypertension, depression, chronic diastolic heart failure and anxiety who was in her usual state of health and then reportedly 3 days prior, started having complaints of abdominal pain with nausea and vomiting.  Husband states that she has had previous episodes of abdominal pain with nausea and vomiting although that is usually midepigastric.  That has not been worked up as an outpatient.  These persisted, he attempted IV fluids.  PCP had called in rectal Phenergan which patient was given by her husband.  This helped some with the nausea.  However, patient became more somnolent and confused in the past 24 hours, so he became concerned and brought the patient into the emergency room.  ED Course: In the emergency room, patient noted to be acutely confused as well as anxious.  Husband was worried about seizures, however ED physician and nursing reported more patient severely anxious.  She was given a dose of IV Ativan.  Lab work noteworthy for an elevated white count of 21.5 and a sodium level of 122.  Normal renal function.  Rest of labs and vitals unremarkable.  CT scan of the abdomen and pelvis noted evidence of colitis.  Patient given a dose of IV Flagyl and vancomycin and hospitalist called for further evaluation and admission.  Review of Systems: Patient seen in the emergency room. Pt had just received IV Ativan and is currently somnolent.  She is unable to give me a review of systems currently    Past Medical History:  Diagnosis Date  . Arthritis   . Depression   .  Gastroenteritis   . Hypertension   Questionable seizures THC use Chronic diastolic heart failure Overweight Questionable history of alcohol abuse History reviewed. No pertinent surgical history.  Social History:  reports that she has quit smoking. She has never used smokeless tobacco. She reports that she drinks about 0.6 oz of alcohol per week. She reports that she has current or past drug history. Drug: Marijuana. Frequency: 7.00 times per week.  From husband: Patient smokes marijuana.  Denies any cigarette smoking.  Ambulates without assistance, lives with him.  There is some confusion as to how much alcohol she takes in.  Currently he says that she drinks socially no more than twice a week and at most 1-3 drinks when she does.  Previous history states otherwise.  See below.   Allergies  Allergen Reactions  . Sulfa Antibiotics Other (See Comments)    Turned bright red     Family History  Problem Relation Age of Onset  . Angina Mother   . Heart attack Mother   . Diabetic kidney disease Sister   . Seizures Sister       Prior to Admission medications   Medication Sig Start Date End Date Taking? Authorizing Provider  Cholecalciferol (VITAMIN D) 2000 UNITS tablet Take 4,000 Units by mouth daily.    [provider]  DULERA 100-5 MCG/ACT AERO Take 2 puffs by mouth 2 (two) times daily. 04/20/17   [provider]  escitalopram (LEXAPRO) 10 MG tablet Take 10 mg by mouth daily after breakfast. 02/27/17   [provider]  folic acid (FOLVITE) 1 MG tablet Take 1 tablet (1 mg total) by mouth daily. 05/05/17   Regalado, Belkys A, MD  losartan (COZAAR) 25 MG tablet Take 1 tablet (25 mg total) by mouth daily. 05/05/17   Regalado, Belkys A, MD  metoprolol succinate (TOPROL-XL) 50 MG 24 hr tablet Take 50 mg by mouth every evening. Take with or immediately following a meal.    [provider]  pantoprazole (PROTONIX) 40 MG tablet Take 40 mg by mouth daily. 04/30/17    [provider]  sucralfate (CARAFATE) 1 g tablet Take 1 g by mouth 2 (two) times daily. 04/30/17   [provider]  thiamine 100 MG tablet Take 1 tablet (100 mg total) by mouth daily. 05/05/17   Regalado, Belkys A, MD  valproic acid (DEPAKENE) 250 MG capsule Take 2 capsules (500 mg total) by mouth 2 (two) times daily. 05/04/17   Regalado, Belkys A, MD  vitamin B-12 (CYANOCOBALAMIN) 500 MCG tablet Take 500 mcg by mouth daily.    [provider]    Physical Exam: BP (!) 138/96   Pulse 89   Temp 98.4 F (36.9 C) (Oral)   Resp 20   Wt 63.5 kg (140 lb)   SpO2 98%   BMI 28.28 kg/m   General: Currently somnolent, at times awakes and is confused Eyes: Sclera nonicteric ENT: Normocephalic and atraumatic, mucous memories are dry Neck: Supple, no JVD Cardiovascular: Regular rate and rhythm, S1-S2 Respiratory: Clear to auscultation bilaterally Abdomen: Soft, nontender, nondistended, hypoactive bowel sounds Skin: No skin breaks, tears or lesions Musculoskeletal: No clubbing or cyanosis or edema Psychiatric: Acutely confused, unaware of surroundings Neurologic: Difficult to ascertain, but appears to have no focal deficits          Labs on Admission:  Basic Metabolic Panel: Recent Labs  Lab 09/13/17 0055  NA 121*  K 3.2*  CL 84*  CO2 19*  GLUCOSE 143*  BUN 20  CREATININE 1.07*  CALCIUM 9.7   Liver Function Tests: Recent Labs  Lab 09/13/17 0055  AST 39  ALT 29  ALKPHOS 66  BILITOT 1.3*  PROT 7.2  ALBUMIN 4.2   No results for input(s): LIPASE, AMYLASE in the last 168 hours. Recent Labs  Lab 09/13/17 0554  AMMONIA 39*   CBC: Recent Labs  Lab 09/13/17 0055  WBC 21.6*  HGB 15.9*  HCT 44.3  MCV 82.8  PLT 344   Cardiac Enzymes: No results for input(s): CKTOTAL, CKMB, CKMBINDEX, TROPONINI in the last 168 hours.  BNP (last 3 results) No results for input(s): BNP in the last 8760 hours.  ProBNP (last 3 results) No results for input(s):  PROBNP in the last 8760 hours.  CBG: Recent Labs  Lab 09/13/17 0043  GLUCAP 147*    Radiological Exams on Admission: Dg Chest 2 View  Result Date: 09/13/2017 CLINICAL DATA:  Acute onset of leukocytosis. Patient unresponsive. EXAM: CHEST - 2 VIEW COMPARISON:  Chest radiograph performed 09/14/2010, and CTA of the chest performed 09/10/2010 FINDINGS: The lungs are well-aerated and clear. There is no evidence of focal opacification, pleural effusion or pneumothorax. The heart is normal in size; the mediastinal contour is within normal limits. No acute osseous abnormalities are seen. IMPRESSION: No acute cardiopulmonary process seen. Electronically Signed   By: Roanna Raider M.D.   On: 09/13/2017 03:40   Ct Head Wo Contrast  Result Date: 09/13/2017 CLINICAL DATA:  Acute onset of altered mental status. EXAM: CT HEAD WITHOUT CONTRAST  TECHNIQUE: Contiguous axial images were obtained from the base of the skull through the vertex without intravenous contrast. COMPARISON:  CT of the head performed 04/30/2017, and MRI of the brain performed 05/01/2017 FINDINGS: Brain: No evidence of acute infarction, hemorrhage, hydrocephalus, extra-axial collection or mass lesion/mass effect. The posterior fossa, including the cerebellum, brainstem and fourth ventricle, is within normal limits. The third and lateral ventricles, and basal ganglia are unremarkable in appearance. The cerebral hemispheres are symmetric in appearance, with normal gray-white differentiation. No mass effect or midline shift is seen. Vascular: No hyperdense vessel or unexpected calcification. Skull: There is no evidence of fracture; visualized osseous structures are unremarkable in appearance. Sinuses/Orbits: Mild proptosis is noted bilaterally. The orbits are otherwise unremarkable. The paranasal sinuses and mastoid air cells are well-aerated. Other: No significant soft tissue abnormalities are seen. IMPRESSION: 1. No acute intracranial pathology seen  on CT. 2. Mild proptosis noted bilaterally. Electronically Signed   By: Roanna Raider M.D.   On: 09/13/2017 03:09   Ct Abdomen Pelvis W Contrast  Result Date: 09/13/2017 CLINICAL DATA:  Acute onset of altered mental status. Generalized abdominal pain. EXAM: CT ABDOMEN AND PELVIS WITH CONTRAST TECHNIQUE: Multidetector CT imaging of the abdomen and pelvis was performed using the standard protocol following bolus administration of intravenous contrast. CONTRAST:  OMNIPAQUE IOHEXOL 300 MG/ML  SOLN COMPARISON:  CT of the abdomen and pelvis performed 04/30/2017 FINDINGS: Lower chest: The visualized lung bases are grossly clear. The visualized portions of the mediastinum are unremarkable. Hepatobiliary: The liver is unremarkable in appearance. The gallbladder is unremarkable in appearance. The common bile duct remains normal in caliber. Pancreas: The pancreas is within normal limits. Spleen: The spleen is unremarkable in appearance. Trace ascites is noted about the spleen. Adrenals/Urinary Tract: The adrenal glands are unremarkable in appearance. Small bilateral renal cysts are noted. There is no evidence of hydronephrosis. No renal or ureteral stones are identified. No perinephric stranding is seen. Stomach/Bowel: The stomach is unremarkable in appearance. The small bowel is within normal limits. The appendix is normal in caliber, without evidence of appendicitis. Mild wall thickening and soft tissue inflammation is noted about the entirety of the colon, concerning for diffuse infectious or inflammatory colitis. Vascular/Lymphatic: The abdominal aorta is unremarkable in appearance. The inferior vena cava is grossly unremarkable. No retroperitoneal lymphadenopathy is seen. No pelvic sidewall lymphadenopathy is identified. Reproductive: The bladder is mildly distended and grossly unremarkable. The uterus is unremarkable in appearance. The ovaries are relatively symmetric. No suspicious adnexal masses are seen.  Other: No additional soft tissue abnormalities are seen. Musculoskeletal: No acute osseous abnormalities are identified. Vacuum phenomenon is noted along the lower thoracic and lumbar spine. The visualized musculature is unremarkable in appearance. IMPRESSION: 1. Mild wall thickening and soft tissue inflammation about the entirety of the colon, concerning for diffuse infectious or inflammatory colitis. 2. Trace ascites noted about the spleen, likely reflecting the colonic process. 3. Small bilateral renal cysts noted. Electronically Signed   By: Roanna Raider M.D.   On: 09/13/2017 05:03    EKG: Ordered  Assessment/Plan Present on Admission: . Acute metabolic encephalopathy: suspect combination of low sodium, dehydration & multiple doses of PR Phenergan in the past few days. (Bedside sedation, Phenergan can cause acute confusion) continue IV fluids, keep NPO & monitor.   . Colitis: Underlying cause of nausea/vomiting.  Infectious versus ischemic.  Patient's husband reports previous history of abdominal discomfort although in the past has been reported more midepigastric.  That may be pancreatitis  versus ulcer.  That appears stable at this time.  Lipase level normal.  Outpatient GI referral. . HTN (hypertension): Given dehydration, holding p.o. . Hyponatremia: Likely from nausea and vomiting from GI issues.  On the possibility of alcohol abuse may be also contributing factor, see below.  Noted minimal change after 6 to 8 hours of IV fluids.  Continue IV fluids and repeat labs in the morning.  If no change, may need fluid restriction and this may be hypervolemic hyponatremia . Dehydration: IV fluids.  As above. . Leukocytosis: Be from infectious colitis versus stress margination.  Received antibiotics x1 dose already.  After 9 hours, CBC only minimally improved, therefore continue IV antibiotics.  Note patient does have a prolonged QT interval of 454 when I rechecked her EKG.  Therefore, will not continue  Cipro and Flagyl, and instead go with Zosyn.  Marland Kitchen. Overweight (BMI 25.0-29.9): Meets criteria with BMI greater than 25 . Depression: Continue home medications Seizure disorder: Noncompliant with Depakote as per husband.  There is no seizure-like activity reported and I am not observing any as well.  If patient continues to remain confused, could consider an EEG, but at this time, seizure-like activity reported.  This is in contrast to her previous hospitalization when seizure-like activity was visualized.  ?  History of alcohol abuse: Husband and niece strongly deny any history of alcohol abuse.  They state that she drinks 6 maybe once or twice a week socially and at that time, as only 1-3 drinks.  That said, in reviewing previous records, she was seen in March and at that time it was reported by multiple providers that the husband had stated that the wife drank at least 2 shots of tequila daily continuously.  Given these discrepancies, certainly need to keep alcohol on the differential diagnosis.  If she does drink covertly on a daily basis, this may account for her hyponatremia. Chronic diastolic heart failure: Looks more on dry side.  That said, will follow daily weights and strict input/output   Principal Problem:   Colitis Active Problems:   Seizure disorder (HCC)   HTN (hypertension)   Hyponatremia   Dehydration   Leukocytosis   Acute metabolic encephalopathy: Unclear etiology although I suspect that it is a combination of    Overweight (BMI 25.0-29.9)   Depression   DVT prophylaxis: Lovenox  Code Status: Full code  Family Communication: Husband and niece at the bedside  Disposition Plan: Will be here for several days, treating colitis, improving sodium and ensuring mentation has normalized  Consults called: None  Admission status: Given expectation patient will be here past 2 midnights requiring acute hospital services, admit as inpatient    Hollice EspySendil K Zakariya Knickerbocker MD [facility] Pager [phone]  If 7PM-7AM, please contact night-coverage [email or web address] Password [email or web address]  09/13/2017, 9:56 AM

## 2017-09-13 NOTE — ED Notes (Addendum)
CRITICAL VALUE ALERT  Critical Value:  K 2.7  Date & Time Notied:  09/13/2017 1115   Provider Notified: Dr. Rito EhrlichKrishnan  Orders Received/Actions taken: D/c pt's NS drip and begin NS with 40meq K at 14300mL/hr

## 2017-09-13 NOTE — ED Notes (Signed)
CBG 147 

## 2017-09-13 NOTE — ED Notes (Signed)
Pt urinated in the bed. This RN and NT cleaned pt and changed linens.

## 2017-09-14 DIAGNOSIS — K529 Noninfective gastroenteritis and colitis, unspecified: Principal | ICD-10-CM

## 2017-09-14 LAB — BASIC METABOLIC PANEL
ANION GAP: 10 (ref 5–15)
BUN: 10 mg/dL (ref 8–23)
CALCIUM: 8.1 mg/dL — AB (ref 8.9–10.3)
CO2: 19 mmol/L — ABNORMAL LOW (ref 22–32)
Chloride: 105 mmol/L (ref 98–111)
Creatinine, Ser: 0.8 mg/dL (ref 0.44–1.00)
GFR calc Af Amer: >60 mL/min (ref >60)
Glucose, Bld: 81 mg/dL (ref 70–99)
Potassium: 3.8 mmol/L (ref 3.5–5.1)
Sodium: 134 mmol/L — ABNORMAL LOW (ref 135–145)

## 2017-09-14 LAB — CBC
HCT: 35.8 % — ABNORMAL LOW (ref 36.0–46.0)
HEMOGLOBIN: 11.8 g/dL — AB (ref 12.0–15.0)
MCH: 29.9 pg (ref 26.0–34.0)
MCHC: 33 g/dL (ref 30.0–36.0)
MCV: 90.6 fL (ref 78.0–100.0)
Platelets: 230 10*3/uL (ref 150–400)
RBC: 3.95 MIL/uL (ref 3.87–5.11)
RDW: 13.3 % (ref 11.5–15.5)
WBC: 7.4 10*3/uL (ref 4.0–10.5)

## 2017-09-14 MED ORDER — AMOXICILLIN-POT CLAVULANATE 875-125 MG PO TABS: 1.0000 | ORAL_TABLET | Freq: Two times a day (BID) | ORAL | 0 refills | Status: AC

## 2017-09-14 MED ORDER — PANTOPRAZOLE SODIUM 40 MG PO TBEC: 40.0000 mg | DELAYED_RELEASE_TABLET | Freq: Every day | ORAL | 0 refills | Status: DC

## 2017-09-14 MED ORDER — ESCITALOPRAM OXALATE 10 MG PO TABS
10.0000 mg | ORAL_TABLET | Freq: Every day | ORAL | Status: DC
  Administered 2017-09-14: 10 mg via ORAL

## 2017-09-14 MED ORDER — ESCITALOPRAM OXALATE 10 MG PO TABS
10.0000 mg | ORAL_TABLET | Freq: Every day | ORAL | Status: DC
  Filled 2017-09-14: qty 1

## 2017-09-14 NOTE — Progress Notes (Signed)
Courtney Miles to be D/C'd to home per MD order.  Discussed with the patient and all questions fully answered.  VSS, Skin clean, dry and intact without evidence of skin break down, no evidence of skin tears noted. IV catheter discontinued intact. Site without signs and symptoms of complications. Dressing and pressure applied.  An After Visit Summary was printed and given to the patient. Patient received prescriptions.  D/c education completed with patient/family including follow up instructions, medication list, d/c activities limitations if indicated, with other d/c instructions as indicated by MD - patient able to verbalize understanding, all questions fully answered.   Patient instructed to return to ED, call 911, or call MD for any changes in condition.   Patient escorted via WC, and D/C home via private auto.  Courtney Miles 09/14/2017 4:13 PM

## 2017-09-14 NOTE — Discharge Summary (Signed)
Physician Discharge Summary  Courtney Miles NWG:[id number] DOB: [birth year] DOA: 09/13/2017  PCP: Gweneth Dimitri, MD  Admit date: 09/13/2017 Discharge date: 09/14/2017  Admitted From: Home Disposition:  Home  Discharge Condition:Stable CODE STATUS:FULL Diet recommendation: Heart Healthy  Brief/Interim Summary: HPI: Courtney Miles is a 62 y.o. female with medical history significant for questionable seizure disorder, questionable alcohol use as well as hypertension, depression, chronic diastolic heart failure and anxiety who was in her usual state of health and then reportedly 3 days prior, started having complaints of abdominal pain with nausea and vomiting.  Husband states that she has had previous episodes of abdominal pain with nausea and vomiting although that is usually midepigastric.  That has not been worked up as an outpatient.  These persisted, he attempted IV fluids.  PCP had called in rectal Phenergan which patient was given by her husband.  This helped some with the nausea.  However, patient became more somnolent and confused in the past 24 hours, so he became concerned and brought the patient into the emergency room.  ED Course: In the emergency room, patient noted to be acutely confused as well as anxious.  Husband was worried about seizures, however ED physician and nursing reported more patient severely anxious.  She was given a dose of IV Ativan.  Lab work noteworthy for an elevated white count of 21.5 and a sodium level of 122.  Normal renal function.  Rest of labs and vitals unremarkable.  CT scan of the abdomen and pelvis noted evidence of colitis.  Patient given a dose of IV Flagyl and vancomycin and hospitalist called for further evaluation and admission.  Hospital Course:  Patient's hospital course remained stable. Her overall status dramatically improved.  This morning she was hemodynamically stable.  Her mental status is on baseline and sees alert and oriented x4.  She  does not complain of any nausea, vomiting or abdominal pain.  We advanced her diet to soft and she tolerated.  Patient is able to take antibiotics by mouth so she can be discharged home today.  Following problems were addressed during her hospitalization:  Acute metabolic encephalopathy: suspect combination of low sodium, dehydration & multiple doses of PR Phenergan in the past few days.  Her mental status completely resolved this morning.  Currently alert and oriented x4.   Colitis: Underlying cause of nausea/vomiting.  Infectious versus ischemic.  Patient's husband reports previous history of abdominal discomfort although in the past has been reported more midepigastric. Lipase level normal.  Outpatient GI referral.  Antibiotics changed to oral  HTN (hypertension): Resume home medication  Hyponatremia: Likely from nausea and vomiting from GI issues. Resolved  Dehydration: Resolved  Leukocytosis: Could Be from infectious colitis versus stress margination.  Resolved  Overweight (BMI 25.0-29.9): Meets criteria with BMI greater than 25  Depression: Continue home medications  Seizure disorder: Noncompliant with Depakote as per husband.  There is no seizure-like activity reported recently.  Will recommend follow-up with neurology as an outpatient for the referral for PCP.  Prolonged QT interval: Improved to 468 from 556 ms  Chronic diastolic heart failure: Looked more on dry side.  Was on IV fluids which has been discontinued now.  Discharge Diagnoses:  Principal Problem:   Colitis Active Problems:   Seizure disorder (HCC)   HTN (hypertension)   Hyponatremia   Dehydration   Leukocytosis   Acute metabolic encephalopathy   Overweight (BMI 25.0-29.9)   Depression   Chronic diastolic CHF (congestive heart failure) (HCC)  Discharge Instructions  Discharge Instructions    Diet - low sodium heart healthy   Complete by:  As directed    Discharge instructions   Complete by:  As  directed    1) Follow up with your PCP as an outpatient in a week. Do a CBC and BMP tests during the follow up. 2) Take prescribed medications as instructed. 3)Follow up with gastroenterology as an outpatient.  Name and number the provider has been attached. 4)Take soft diet for next 3-5  days .   Increase activity slowly   Complete by:  As directed      Allergies as of 09/14/2017      Reactions   Sulfa Antibiotics Other (See Comments)   Turned bright red      Medication List    STOP taking these medications   folic acid 1 MG tablet Commonly known as:  FOLVITE   thiamine 100 MG tablet   valproic acid 250 MG capsule Commonly known as:  DEPAKENE     TAKE these medications   amoxicillin-clavulanate 875-125 MG tablet Commonly known as:  AUGMENTIN Take 1 tablet by mouth 2 (two) times daily for 6 days.   DULERA 100-5 MCG/ACT Aero Generic drug:  mometasone-formoterol Take 2 puffs by mouth 2 (two) times daily.   escitalopram 10 MG tablet Commonly known as:  LEXAPRO Take 10 mg by mouth daily after breakfast.   losartan 25 MG tablet Commonly known as:  COZAAR Take 1 tablet (25 mg total) by mouth daily.   metoprolol succinate 50 MG 24 hr tablet Commonly known as:  TOPROL-XL Take 50 mg by mouth every evening. Take with or immediately following a meal.   pantoprazole 40 MG tablet Commonly known as:  PROTONIX Take 1 tablet (40 mg total) by mouth daily. Start taking on:  09/15/2017   promethazine 25 MG tablet Commonly known as:  PHENERGAN Take 25 mg by mouth every 6 (six) hours as needed for nausea.   promethazine 25 MG suppository Commonly known as:  PHENERGAN Place 25 mg rectally every 6 (six) hours as needed for nausea/vomiting.   Vitamin D 2000 units tablet Take 4,000 Units by mouth daily.      Follow-up Information    Gweneth Dimitri, MD. Schedule an appointment as soon as possible for a visit in 1 week(s).   Specialty:  Family Medicine Contact  information: [address] [place] [address] [phone]        Lynann Bologna, MD. Schedule an appointment as soon as possible for a visit in 2 week(s).   Specialties:  Gastroenterology, Internal Medicine Contact information: [address] [place] [address] [phone]          Allergies  Allergen Reactions  . Sulfa Antibiotics Other (See Comments)    Turned bright red     Consultations:  None   Procedures/Studies: Dg Chest 2 View  Result Date: 09/13/2017 CLINICAL DATA:  Acute onset of leukocytosis. Patient unresponsive. EXAM: CHEST - 2 VIEW COMPARISON:  Chest radiograph performed 09/14/2010, and CTA of the chest performed 09/10/2010 FINDINGS: The lungs are well-aerated and clear. There is no evidence of focal opacification, pleural effusion or pneumothorax. The heart is normal in size; the mediastinal contour is within normal limits. No acute osseous abnormalities are seen. IMPRESSION: No acute cardiopulmonary process seen. Electronically Signed   By: Roanna Raider M.D.   On: 09/13/2017 03:40   Ct Head Wo Contrast  Result Date: 09/13/2017 CLINICAL DATA:  Acute onset of altered mental status. EXAM: CT HEAD WITHOUT CONTRAST TECHNIQUE: Contiguous axial images were obtained from the base of the skull through the vertex without intravenous contrast. COMPARISON:  CT of the head performed 04/30/2017, and MRI of the brain performed 05/01/2017 FINDINGS: Brain: No evidence of acute infarction, hemorrhage, hydrocephalus, extra-axial collection or mass lesion/mass effect. The posterior fossa, including the cerebellum, brainstem and fourth ventricle, is within normal limits. The third and lateral ventricles, and basal ganglia are unremarkable in appearance. The cerebral hemispheres are symmetric in appearance, with normal gray-white differentiation. No mass effect or midline shift is seen. Vascular: No hyperdense vessel or unexpected calcification.  Skull: There is no evidence of fracture; visualized osseous structures are unremarkable in appearance. Sinuses/Orbits: Mild proptosis is noted bilaterally. The orbits are otherwise unremarkable. The paranasal sinuses and mastoid air cells are well-aerated. Other: No significant soft tissue abnormalities are seen. IMPRESSION: 1. No acute intracranial pathology seen on CT. 2. Mild proptosis noted bilaterally. Electronically Signed   By: Roanna Raider M.D.   On: 09/13/2017 03:09   Ct Abdomen Pelvis W Contrast  Result Date: 09/13/2017 CLINICAL DATA:  Acute onset of altered mental status. Generalized abdominal pain. EXAM: CT ABDOMEN AND PELVIS WITH CONTRAST TECHNIQUE: Multidetector CT imaging of the abdomen and pelvis was performed using the standard protocol following bolus administration of intravenous contrast. CONTRAST:  OMNIPAQUE IOHEXOL 300 MG/ML  SOLN COMPARISON:  CT of the abdomen and pelvis performed 04/30/2017 FINDINGS: Lower chest: The visualized lung bases are grossly clear. The visualized portions of the mediastinum are unremarkable. Hepatobiliary: The liver is unremarkable in appearance. The gallbladder is unremarkable in appearance. The common bile duct remains normal in caliber. Pancreas: The pancreas is within normal limits. Spleen: The spleen is unremarkable in appearance. Trace ascites is noted about the spleen. Adrenals/Urinary Tract: The adrenal glands are unremarkable in appearance. Small bilateral renal cysts are noted. There is no evidence of hydronephrosis. No renal or ureteral stones are identified. No perinephric stranding is seen. Stomach/Bowel: The stomach is unremarkable in appearance. The small bowel is within normal limits. The appendix is normal in caliber, without evidence of appendicitis. Mild wall thickening and soft tissue inflammation is noted about the entirety of the colon, concerning for diffuse infectious or inflammatory colitis. Vascular/Lymphatic: The abdominal aorta  is unremarkable in appearance. The inferior vena cava is grossly unremarkable. No retroperitoneal lymphadenopathy is seen. No pelvic sidewall lymphadenopathy is identified. Reproductive: The bladder is mildly distended and grossly unremarkable. The uterus is unremarkable in appearance. The ovaries are relatively symmetric. No suspicious adnexal masses are seen. Other: No additional soft tissue abnormalities are seen. Musculoskeletal: No acute osseous abnormalities are identified. Vacuum phenomenon is noted along the lower thoracic and lumbar spine. The visualized musculature is unremarkable in appearance. IMPRESSION: 1. Mild wall thickening and soft tissue inflammation about the entirety of the colon, concerning for diffuse infectious or inflammatory colitis. 2. Trace ascites noted about the spleen, likely reflecting the colonic process. 3. Small bilateral renal cysts noted. Electronically Signed   By: Roanna Raider M.D.   On: 09/13/2017 05:03      Discharge Exam: Vitals:   09/13/17 1950 09/14/17 0536  BP: 117/77 96/68  Pulse: 87 82  Resp: 18 18  Temp: 99.5 F (37.5 C) 97.8 F (36.6 C)  SpO2: 100% 99%   Vitals:   09/13/17 1718 09/13/17 1719 09/13/17 1950 09/14/17 0536  BP: (!) 123/105  117/77 96/68  Pulse: 91  87 82  Resp: 18  18 18  Temp:   99.5 F (37.5 C) 97.8 F (36.6 C)  TempSrc:   Oral Oral  SpO2: 98% 98% 100% 99%  Weight:        General: Pt is alert, awake, not in acute distress Cardiovascular: RRR, S1/S2 +, no rubs, no gallops Respiratory: CTA bilaterally, no wheezing, no rhonchi Abdominal: Soft, NT, ND, bowel sounds + Extremities: no edema, no cyanosis    The results of significant diagnostics from this hospitalization (including imaging, microbiology, ancillary and laboratory) are listed below for reference.     Microbiology: Recent Results (from the past 240 hour(s))  Blood culture (routine x 2)     Status: None (Preliminary result)   Collection Time: 09/13/17   1:50 AM  Result Value Ref Range Status   Specimen Description BLOOD LEFT HAND  Final   Special Requests   Final    BOTTLES DRAWN AEROBIC AND ANAEROBIC Blood Culture results may not be optimal due to an inadequate volume of blood received in culture bottles   Culture   Final    NO GROWTH 1 DAY Performed at [facility], [address], [place], [place] [address]    Report Status PENDING  Incomplete  Blood culture (routine x 2)     Status: None (Preliminary result)   Collection Time: 09/13/17  1:56 AM  Result Value Ref Range Status   Specimen Description BLOOD RIGHT HAND  Final   Special Requests   Final    BOTTLES DRAWN AEROBIC AND ANAEROBIC Blood Culture results may not be optimal due to an inadequate volume of blood received in culture bottles   Culture   Final    NO GROWTH 1 DAY Performed at [facility], [address], [place], [place] [address]    Report Status PENDING  Incomplete     Labs: BNP (last 3 results) No results for input(s): BNP in the last 8760 hours. Basic Metabolic Panel: Recent Labs  Lab 09/13/17 0055 09/13/17 0937 09/14/17 0500  NA 121* 122* 134*  K 3.2* 2.7* 3.8  CL 84* 86* 105  CO2 19* 20* 19*  GLUCOSE 143* 101* 81  BUN 20 12 10   CREATININE 1.07* 0.73 0.80  CALCIUM 9.7 8.6* 8.1*   Liver Function Tests: Recent Labs  Lab 09/13/17 0055  AST 39  ALT 29  ALKPHOS 66  BILITOT 1.3*  PROT 7.2  ALBUMIN 4.2   Recent Labs  Lab 09/13/17 0937  LIPASE 31   Recent Labs  Lab 09/13/17 0554  AMMONIA 39*   CBC: Recent Labs  Lab 09/13/17 0055 09/13/17 0937 09/14/17 0500  WBC 21.6* 19.5* 7.4  HGB 15.9* 14.8 11.8*  HCT 44.3 42.2 35.8*  MCV 82.8 85.1 90.6  PLT 344 289 230   Cardiac Enzymes: No results for input(s): CKTOTAL, CKMB, CKMBINDEX, TROPONINI in the last 168 hours. BNP: Invalid input(s): POCBNP CBG: Recent Labs  Lab 09/13/17 0043  GLUCAP 147*   D-Dimer No results for input(s): DDIMER in the last 72 hours. Hgb  A1c No results for input(s): HGBA1C in the last 72 hours. Lipid Profile No results for input(s): CHOL, HDL, LDLCALC, TRIG, CHOLHDL, LDLDIRECT in the last 72 hours. Thyroid function studies No results for input(s): TSH, T4TOTAL, T3FREE, THYROIDAB in the last 72 hours.  Invalid input(s): FREET3 Anemia work up No results for input(s): VITAMINB12, FOLATE, FERRITIN, TIBC, IRON, RETICCTPCT in the last 72 hours. Urinalysis    Component Value Date/Time   COLORURINE YELLOW 09/13/2017 0237  APPEARANCEUR HAZY (A) 09/13/2017 0237   LABSPEC 1.019 09/13/2017 0237   PHURINE 6.0 09/13/2017 0237   GLUCOSEU 50 (A) 09/13/2017 0237   HGBUR MODERATE (A) 09/13/2017 0237   BILIRUBINUR NEGATIVE 09/13/2017 0237   KETONESUR 5 (A) 09/13/2017 0237   PROTEINUR >=300 (A) 09/13/2017 0237   UROBILINOGEN 0.2 09/10/2010 0951   NITRITE NEGATIVE 09/13/2017 0237   LEUKOCYTESUR NEGATIVE 09/13/2017 0237   Sepsis Labs Invalid input(s): PROCALCITONIN,  WBC,  LACTICIDVEN Microbiology Recent Results (from the past 240 hour(s))  Blood culture (routine x 2)     Status: None (Preliminary result)   Collection Time: 09/13/17  1:50 AM  Result Value Ref Range Status   Specimen Description BLOOD LEFT HAND  Final   Special Requests   Final    BOTTLES DRAWN AEROBIC AND ANAEROBIC Blood Culture results may not be optimal due to an inadequate volume of blood received in culture bottles   Culture   Final    NO GROWTH 1 DAY Performed at [facility], [address] [place], [place] [address]    Report Status PENDING  Incomplete  Blood culture (routine x 2)     Status: None (Preliminary result)   Collection Time: 09/13/17  1:56 AM  Result Value Ref Range Status   Specimen Description BLOOD RIGHT HAND  Final   Special Requests   Final    BOTTLES DRAWN AEROBIC AND ANAEROBIC Blood Culture results may not be optimal due to an inadequate volume of blood received in culture bottles   Culture   Final    NO GROWTH 1  DAY Performed at [facility], [address] [place], [place] [address]    Report Status PENDING  Incomplete    Please note: You were cared for by a hospitalist during your hospital stay. Once you are discharged, your primary care physician will handle any further medical issues. Please note that NO REFILLS for any discharge medications will be authorized once you are discharged, as it is imperative that you return to your primary care physician (or establish a relationship with a primary care physician if you do not have one) for your post hospital discharge needs so that they can reassess your need for medications and monitor your lab values.    Time coordinating discharge: 40 minutes  SIGNED:   Burnadette PopAmrit Olivia Royse, MD  [facility] 09/14/2017, 3:05 PM Pager [phone]  If 7PM-7AM, please contact night-coverage [email or web address] Password [email or web address]

## 2017-09-18 LAB — CULTURE, BLOOD (ROUTINE X 2)
CULTURE: NO GROWTH
Culture: NO GROWTH

## 2017-09-27 NOTE — ED Provider Notes (Signed)
[facility] PROGRESSIVE CARE Provider Note   CSN: [id number] Arrival date & time: 09/13/17  0032     History   Chief Complaint Chief Complaint  Patient presents with  . Altered Mental Status    HPI Courtney Miles is a 62 y.o. female.  HPI  This is a 62 year old female with a history of hypertension, depression who presents with altered mental status.  Her husband provides most of the history.  He reports that tonight she became acutely altered.  She he states that she started yelling and not making sense.  She has done this once previously which he relates to low potassium.  Per EMS, she required 5 of Versed.  Husband reports that recently she has complained of abdominal pain and has had diarrhea.  No active vomiting or nausea.  No noted fevers.  Patient is noncontributory to history taking.  I did review the chart.  She was admitted for an acute episode of encephalopathy.  This was thought to be secondary to withdrawal.  Patient adamantly denies recurrent or ongoing alcohol use or abuse.  He states that she will occasionally have a drink.  Level 5 caveat for altered mental status.  Past Medical History:  Diagnosis Date  . Arthritis   . Depression   . Gastroenteritis   . Hypertension     Patient Active Problem List   Diagnosis Date Noted  . Acute metabolic encephalopathy 09/13/2017  . Colitis 09/13/2017  . Overweight (BMI 25.0-29.9) 09/13/2017  . Depression 09/13/2017  . Chronic diastolic CHF (congestive heart failure) (HCC) 09/13/2017  . Nausea & vomiting 04/30/2017  . Dehydration 04/30/2017  . Leukocytosis 04/30/2017  . Hypokalemia 04/30/2017  . Prolonged QT interval 04/30/2017  . Post-ictal state (HCC) 04/30/2017  . Gastroenteritis   . Altered mental status 04/02/2012  . Seizure disorder (HCC) 03/31/2012  . Abnormal EKG 03/31/2012  . 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 HTN (hypertension) 03/31/2012  . Hyponatremia 03/31/2012  . Viral illness 03/31/2012     History reviewed. No pertinent surgical history.   OB History   None      Home Medications    Prior to Admission medications   Medication Sig Start Date End Date Taking? Authorizing Provider  Cholecalciferol (VITAMIN D) 2000 UNITS tablet Take 4,000 Units by mouth daily.   Yes [provider]  DULERA 100-5 MCG/ACT AERO Take 2 puffs by mouth 2 (two) times daily. 04/20/17  Yes [provider]  escitalopram (LEXAPRO) 10 MG tablet Take 10 mg by mouth daily after breakfast. 02/27/17  Yes [provider]  losartan (COZAAR) 25 MG tablet Take 1 tablet (25 mg total) by mouth daily. 05/05/17  Yes Regalado, Belkys A, MD  metoprolol succinate (TOPROL-XL) 50 MG 24 hr tablet Take 50 mg by mouth every evening. Take with or immediately following a meal.   Yes [provider]  promethazine (PHENERGAN) 25 MG suppository Place 25 mg rectally every 6 (six) hours as needed for nausea/vomiting. 09/11/17  Yes [provider]  promethazine (PHENERGAN) 25 MG tablet Take 25 mg by mouth every 6 (six) hours as needed for nausea. 06/13/17  Yes [provider]  pantoprazole (PROTONIX) 40 MG tablet Take 1 tablet (40 mg total) by mouth daily. 09/15/17   Burnadette Pop, MD    Family History Family History  Problem Relation Age of Onset  . Angina Mother   . Heart attack Mother   . Diabetic kidney disease Sister   . Seizures Sister  Social History Social History   Tobacco Use  . Smoking status: Former Games developermoker  . Smokeless tobacco: Never Used  Substance Use Topics  . Alcohol use: Yes    Alcohol/week: 1.0 standard drinks    Types: 1 Standard drinks or equivalent per week  . Drug use: Yes    Frequency: 7.0 times per week    Types: Marijuana     Allergies   Sulfa antibiotics   Review of Systems Review of Systems  Unable to perform ROS: Mental status change     Physical Exam Updated Vital Signs BP 119/73 (BP Location: Right Arm)   Pulse 84    Temp 98 F (36.7 C) (Oral)   Resp 18   Wt 63.5 kg   SpO2 94%   BMI 28.28 kg/m   Physical Exam  Constitutional:  Agitated but overall nontoxic-appearing  HENT:  Head: Normocephalic and atraumatic.  Eyes: Pupils are equal, round, and reactive to light.  Neck: Normal range of motion. Neck supple.  Cardiovascular: Normal rate, regular rhythm and normal heart sounds.  No murmur heard. Pulmonary/Chest: Effort normal and breath sounds normal. No respiratory distress. She has no wheezes.  Abdominal: Soft. Bowel sounds are normal. There is tenderness. There is no rebound and no guarding.  Left mid abdominal tenderness palpation, no rebound or guarding  Musculoskeletal:  Trace bilateral lower extremity edema  Neurological: She is alert.  Alert, agitated, disoriented, appears to move all 4 extremities  Skin: Skin is warm and dry.  Psychiatric:  Agitated  Nursing note and vitals reviewed.    ED Treatments / Results  Labs (all labs ordered are listed, but only abnormal results are displayed) Labs Reviewed  COMPREHENSIVE METABOLIC PANEL - Abnormal; Notable for the following components:      Result Value   Sodium 121 (*)    Potassium 3.2 (*)    Chloride 84 (*)    CO2 19 (*)    Glucose, Bld 143 (*)    Creatinine, Ser 1.07 (*)    Total Bilirubin 1.3 (*)    GFR calc non Af Amer 54 (*)    Anion gap 18 (*)    All other components within normal limits  CBC - Abnormal; Notable for the following components:   WBC 21.6 (*)    RBC 5.35 (*)    Hemoglobin 15.9 (*)    All other components within normal limits  RAPID URINE DRUG SCREEN, HOSP PERFORMED - Abnormal; Notable for the following components:   Benzodiazepines POSITIVE (*)    Tetrahydrocannabinol POSITIVE (*)    All other components within normal limits  URINALYSIS, ROUTINE W REFLEX MICROSCOPIC - Abnormal; Notable for the following components:   APPearance HAZY (*)    Glucose, UA 50 (*)    Hgb urine dipstick MODERATE (*)     Ketones, ur 5 (*)    Protein, ur >=300 (*)    All other components within normal limits  AMMONIA - Abnormal; Notable for the following components:   Ammonia 39 (*)    All other components within normal limits  CBC - Abnormal; Notable for the following components:   WBC 19.5 (*)    All other components within normal limits  BASIC METABOLIC PANEL - Abnormal; Notable for the following components:   Sodium 122 (*)    Potassium 2.7 (*)    Chloride 86 (*)    CO2 20 (*)    Glucose, Bld 101 (*)    Calcium 8.6 (*)    Anion  gap 16 (*)    All other components within normal limits  BASIC METABOLIC PANEL - Abnormal; Notable for the following components:   Sodium 134 (*)    CO2 19 (*)    Calcium 8.1 (*)    All other components within normal limits  CBC - Abnormal; Notable for the following components:   Hemoglobin 11.8 (*)    HCT 35.8 (*)    All other components within normal limits  CBG MONITORING, ED - Abnormal; Notable for the following components:   Glucose-Capillary 147 (*)    All other components within normal limits  CULTURE, BLOOD (ROUTINE X 2)  CULTURE, BLOOD (ROUTINE X 2)  ETHANOL  LIPASE, BLOOD  CBG MONITORING, ED  I-STAT CG4 LACTIC ACID, ED  I-STAT CG4 LACTIC ACID, ED    EKG EKG Interpretation  Date/Time:  Tuesday Miles 06 2019 10:22:59 EDT Ventricular Rate:  90 PR Interval:    QRS Duration: 95 QT Interval:  454 QTC Calculation: 556 R Axis:   -59 Text Interpretation:  Sinus rhythm Left anterior fascicular block Prolonged QT interval Non-specific ST-t changes Confirmed by Cathren LaineSteinl, Kevin ([id number]) on 09/13/2017 12:41:52 PM   Radiology No results found.  Procedures Procedures (including critical care time)  CRITICAL CARE Performed by: Shon BatonHORTON, Tyyne Cliett F   Total critical care time: 45 minutes  Critical care time was exclusive of separately billable procedures and treating other patients.  Critical care was necessary to treat or prevent imminent or  life-threatening deterioration.  Critical care was time spent personally by me on the following activities: development of treatment plan with patient and/or surrogate as well as nursing, discussions with consultants, evaluation of patient's response to treatment, examination of patient, obtaining history from patient or surrogate, ordering and performing treatments and interventions, ordering and review of laboratory studies, ordering and review of radiographic studies, pulse oximetry and re-evaluation of patient's condition.   Medications Ordered in ED Medications  LORazepam (ATIVAN) injection 1 mg (1 mg Intravenous Given 09/13/17 0159)  sodium chloride 0.9 % bolus 1,000 mL (0 mLs Intravenous Stopped 09/13/17 0300)  iohexol (OMNIPAQUE) 300 MG/ML solution 100 mL (100 mLs Intravenous Contrast Given 09/13/17 0406)  ciprofloxacin (CIPRO) IVPB 400 mg (0 mg Intravenous Stopped 09/13/17 0726)  metroNIDAZOLE (FLAGYL) IVPB 500 mg (0 mg Intravenous Stopped 09/13/17 0726)  LORazepam (ATIVAN) injection 1 mg (1 mg Intravenous Given by Other 09/13/17 0800)  piperacillin-tazobactam (ZOSYN) IVPB 3.375 g (0 g Intravenous Stopped 09/13/17 1743)     Initial Impression / Assessment and Plan / ED Course  I have reviewed the triage vital signs and the nursing notes.  Pertinent labs & imaging results that were available during my care of the patient were reviewed by me and considered in my medical decision making (see chart for details).     Patient presents with altered mental status.  She is agitated but nontoxic-appearing.  Her vital signs are largely reassuring.  Previous history of the same which was attributed to likely alcohol withdrawal however husband denies this in his history.  She does have some abdominal tenderness and he does report recent diarrhea.  Work-up initiated.  Patient was given Ativan.  She was given fluids.  Work-up notable for hyponatremia with a sodium in the 120s.  Patient also has a significant  leukocytosis to 19.  She does not at this time appear septic.  Given abdominal pain and leukocytosis, CT scan was obtained which shows some evidence of colitis.  Patient was given broad-spectrum antibiotics.  Additionally,  CT scan of the head is negative for acute process.  UDS and ammonia pending.  Patient appears to have an acute encephalopathy.  Will admit for further work-up.  Final Clinical Impressions(s) / ED Diagnoses   Final diagnoses:  Encephalopathy  Hyponatremia  Colitis    ED Discharge Orders         Ordered    pantoprazole (PROTONIX) 40 MG tablet  Daily     09/14/17 1505    amoxicillin-clavulanate (AUGMENTIN) 875-125 MG tablet  2 times daily     09/14/17 1505    Increase activity slowly     09/14/17 1505    Diet - low sodium heart healthy     09/14/17 1505    Discharge instructions    Comments:  1) Follow up with your PCP as an outpatient in a week. Do a CBC and BMP tests during the follow up. 2) Take prescribed medications as instructed. 3)Follow up with gastroenterology as an outpatient.  Name and number the provider has been attached. 4)Take soft diet for next 3-5  days .   09/14/17 1505           Shon Baton, MD 09/27/17 [phone]

## 2017-11-13 ENCOUNTER — Encounter (HOSPITAL_COMMUNITY): Payer: Self-pay

## 2017-11-13 ENCOUNTER — Inpatient Hospital Stay (HOSPITAL_COMMUNITY)
Admission: EM | Admit: 2017-11-13 | Discharge: 2017-11-15 | DRG: 392 | Disposition: A | Discharge status: Home / Self Care | Payer: Self-pay | Attending: Family Medicine | Admitting: Family Medicine

## 2017-11-13 ENCOUNTER — Other Ambulatory Visit: Payer: Self-pay

## 2017-11-13 DIAGNOSIS — R112 Nausea with vomiting, unspecified: Secondary | ICD-10-CM | POA: Diagnosis present

## 2017-11-13 DIAGNOSIS — R1115 Cyclical vomiting syndrome unrelated to migraine: Secondary | ICD-10-CM

## 2017-11-13 DIAGNOSIS — R9431 Abnormal electrocardiogram [ECG] [EKG]: Secondary | ICD-10-CM | POA: Diagnosis present

## 2017-11-13 DIAGNOSIS — F419 Anxiety disorder, unspecified: Secondary | ICD-10-CM | POA: Diagnosis present

## 2017-11-13 DIAGNOSIS — R6510 Systemic inflammatory response syndrome (SIRS) of non-infectious origin without acute organ dysfunction: Secondary | ICD-10-CM | POA: Diagnosis present

## 2017-11-13 DIAGNOSIS — M1990 Unspecified osteoarthritis, unspecified site: Secondary | ICD-10-CM | POA: Diagnosis present

## 2017-11-13 DIAGNOSIS — K529 Noninfective gastroenteritis and colitis, unspecified: Principal | ICD-10-CM | POA: Diagnosis present

## 2017-11-13 DIAGNOSIS — I5032 Chronic diastolic (congestive) heart failure: Secondary | ICD-10-CM | POA: Diagnosis present

## 2017-11-13 DIAGNOSIS — G40909 Epilepsy, unspecified, not intractable, without status epilepticus: Secondary | ICD-10-CM | POA: Diagnosis present

## 2017-11-13 DIAGNOSIS — K219 Gastro-esophageal reflux disease without esophagitis: Secondary | ICD-10-CM | POA: Diagnosis present

## 2017-11-13 DIAGNOSIS — Z87891 Personal history of nicotine dependence: Secondary | ICD-10-CM

## 2017-11-13 DIAGNOSIS — E876 Hypokalemia: Secondary | ICD-10-CM | POA: Diagnosis present

## 2017-11-13 DIAGNOSIS — G43A1 Cyclical vomiting, intractable: Secondary | ICD-10-CM

## 2017-11-13 DIAGNOSIS — I110 Hypertensive heart disease with heart failure: Secondary | ICD-10-CM | POA: Diagnosis present

## 2017-11-13 DIAGNOSIS — F329 Major depressive disorder, single episode, unspecified: Secondary | ICD-10-CM | POA: Diagnosis present

## 2017-11-13 DIAGNOSIS — Z882 Allergy status to sulfonamides status: Secondary | ICD-10-CM

## 2017-11-13 LAB — COMPREHENSIVE METABOLIC PANEL
ALBUMIN: 4.9 g/dL (ref 3.5–5.0)
ALK PHOS: 62 U/L (ref 38–126)
ALT: 18 U/L (ref 0–44)
AST: 24 U/L (ref 15–41)
Anion gap: 18 — ABNORMAL HIGH (ref 5–15)
BILIRUBIN TOTAL: 0.9 mg/dL (ref 0.3–1.2)
BUN: 10 mg/dL (ref 8–23)
CO2: 18 mmol/L — AB (ref 22–32)
CREATININE: 0.59 mg/dL (ref 0.44–1.00)
Calcium: 9.5 mg/dL (ref 8.9–10.3)
Chloride: 102 mmol/L (ref 98–111)
GFR calc Af Amer: >60 mL/min (ref >60)
GFR calc non Af Amer: >60 mL/min (ref >60)
GLUCOSE: 167 mg/dL — AB (ref 70–99)
Potassium: 2.9 mmol/L — ABNORMAL LOW (ref 3.5–5.1)
Sodium: 138 mmol/L (ref 135–145)
TOTAL PROTEIN: 8.5 g/dL — AB (ref 6.5–8.1)

## 2017-11-13 LAB — CBC WITH DIFFERENTIAL/PLATELET
Basophils Absolute: 0 10*3/uL (ref 0.0–0.1)
Basophils Relative: 0 %
EOS PCT: 0 %
Eosinophils Absolute: 0 10*3/uL (ref 0.0–0.7)
HEMATOCRIT: 41.7 % (ref 36.0–46.0)
Hemoglobin: 14.2 g/dL (ref 12.0–15.0)
LYMPHS PCT: 8 %
Lymphs Abs: 1.2 10*3/uL (ref 0.7–4.0)
MCH: 29.6 pg (ref 26.0–34.0)
MCHC: 34.1 g/dL (ref 30.0–36.0)
MCV: 87.1 fL (ref 78.0–100.0)
MONOS PCT: 5 %
Monocytes Absolute: 0.8 10*3/uL (ref 0.1–1.0)
NEUTROS PCT: 87 %
Neutro Abs: 13.2 10*3/uL — ABNORMAL HIGH (ref 1.7–7.7)
Platelets: 318 10*3/uL (ref 150–400)
RBC: 4.79 MIL/uL (ref 3.87–5.11)
RDW: 13.5 % (ref 11.5–15.5)
WBC: 15.2 10*3/uL — ABNORMAL HIGH (ref 4.0–10.5)

## 2017-11-13 LAB — I-STAT CHEM 8, ED
BUN: 8 mg/dL (ref 8–23)
CALCIUM ION: 0.99 mmol/L — AB (ref 1.15–1.40)
CHLORIDE: 102 mmol/L (ref 98–111)
Creatinine, Ser: 0.5 mg/dL (ref 0.44–1.00)
Glucose, Bld: 172 mg/dL — ABNORMAL HIGH (ref 70–99)
HCT: 45 % (ref 36.0–46.0)
HEMOGLOBIN: 15.3 g/dL — AB (ref 12.0–15.0)
Potassium: 2.8 mmol/L — ABNORMAL LOW (ref 3.5–5.1)
Sodium: 135 mmol/L (ref 135–145)
TCO2: 21 mmol/L — ABNORMAL LOW (ref 22–32)

## 2017-11-13 LAB — URINALYSIS, ROUTINE W REFLEX MICROSCOPIC
BACTERIA UA: NONE SEEN
Bilirubin Urine: NEGATIVE
Glucose, UA: >=500 mg/dL — AB
Ketones, ur: 20 mg/dL — AB
Leukocytes, UA: NEGATIVE
Nitrite: NEGATIVE
Protein, ur: 30 mg/dL — AB
SPECIFIC GRAVITY, URINE: 1.006 (ref 1.005–1.030)
pH: 9 — ABNORMAL HIGH (ref 5.0–8.0)

## 2017-11-13 LAB — TROPONIN I: Troponin I: <0.03 ng/mL (ref <0.03)

## 2017-11-13 LAB — RAPID URINE DRUG SCREEN, HOSP PERFORMED
AMPHETAMINES: NOT DETECTED
Barbiturates: NOT DETECTED
Benzodiazepines: NOT DETECTED
Cocaine: NOT DETECTED
Opiates: NOT DETECTED
Tetrahydrocannabinol: POSITIVE — AB

## 2017-11-13 LAB — LIPASE, BLOOD: Lipase: 28 U/L (ref 11–51)

## 2017-11-13 LAB — MAGNESIUM: MAGNESIUM: 1.6 mg/dL — AB (ref 1.7–2.4)

## 2017-11-13 LAB — POTASSIUM: Potassium: 5.1 mmol/L (ref 3.5–5.1)

## 2017-11-13 MED ORDER — SODIUM CHLORIDE 0.9 % IV SOLN
INTRAVENOUS | Status: DC
  Administered 2017-11-13: 21:00:00 via INTRAVENOUS

## 2017-11-13 MED ORDER — PROMETHAZINE HCL 25 MG/ML IJ SOLN
12.5000 mg | Freq: Four times a day (QID) | INTRAMUSCULAR | Status: AC | PRN
  Administered 2017-11-13: 12.5 mg via INTRAVENOUS
  Filled 2017-11-13: qty 1

## 2017-11-13 MED ORDER — METOCLOPRAMIDE HCL 5 MG/ML IJ SOLN
10.0000 mg | Freq: Once | INTRAMUSCULAR | Status: AC
  Administered 2017-11-13: 10 mg via INTRAVENOUS
  Filled 2017-11-13: qty 2

## 2017-11-13 MED ORDER — ENOXAPARIN SODIUM 40 MG/0.4ML ~~LOC~~ SOLN
40.0000 mg | SUBCUTANEOUS | Status: DC
  Administered 2017-11-13 – 2017-11-14 (×2): 40 mg via SUBCUTANEOUS
  Filled 2017-11-13 (×2): qty 0.4

## 2017-11-13 MED ORDER — POTASSIUM CHLORIDE IN NACL 40-0.9 MEQ/L-% IV SOLN
INTRAVENOUS | Status: DC
  Administered 2017-11-13: 75 mL/h via INTRAVENOUS
  Filled 2017-11-13 (×2): qty 1000

## 2017-11-13 MED ORDER — PIPERACILLIN-TAZOBACTAM 3.375 G IVPB
3.3750 g | Freq: Three times a day (TID) | INTRAVENOUS | Status: DC
  Administered 2017-11-13 – 2017-11-15 (×5): 3.375 g via INTRAVENOUS
  Filled 2017-11-13 (×5): qty 50

## 2017-11-13 MED ORDER — SODIUM CHLORIDE 0.9 % IV BOLUS
1000.0000 mL | Freq: Once | INTRAVENOUS | Status: AC
  Administered 2017-11-13: 1000 mL via INTRAVENOUS

## 2017-11-13 MED ORDER — PROMETHAZINE HCL 25 MG/ML IJ SOLN
12.5000 mg | Freq: Once | INTRAMUSCULAR | Status: AC
  Administered 2017-11-13: 12.5 mg via INTRAVENOUS
  Filled 2017-11-13: qty 1

## 2017-11-13 MED ORDER — MAGNESIUM SULFATE IN D5W 1-5 GM/100ML-% IV SOLN
1.0000 g | Freq: Once | INTRAVENOUS | Status: AC
  Administered 2017-11-13: 1 g via INTRAVENOUS
  Filled 2017-11-13: qty 100

## 2017-11-13 MED ORDER — POTASSIUM CHLORIDE CRYS ER 20 MEQ PO TBCR
40.0000 meq | EXTENDED_RELEASE_TABLET | Freq: Once | ORAL | Status: DC
  Filled 2017-11-13: qty 2

## 2017-11-13 MED ORDER — LOSARTAN POTASSIUM 25 MG PO TABS
25.0000 mg | ORAL_TABLET | Freq: Every day | ORAL | Status: DC
  Administered 2017-11-14 – 2017-11-15 (×2): 25 mg via ORAL
  Filled 2017-11-13 (×3): qty 1

## 2017-11-13 MED ORDER — MORPHINE SULFATE (PF) 4 MG/ML IV SOLN
4.0000 mg | Freq: Once | INTRAVENOUS | Status: AC
  Administered 2017-11-13: 4 mg via INTRAVENOUS
  Filled 2017-11-13: qty 1

## 2017-11-13 MED ORDER — VITAMIN D3 25 MCG (1000 UNIT) PO TABS
4000.0000 [IU] | ORAL_TABLET | Freq: Every day | ORAL | Status: DC
  Administered 2017-11-14 – 2017-11-15 (×2): 4000 [IU] via ORAL
  Filled 2017-11-13 (×3): qty 4

## 2017-11-13 MED ORDER — METOPROLOL SUCCINATE ER 50 MG PO TB24
50.0000 mg | ORAL_TABLET | Freq: Every day | ORAL | Status: DC
  Administered 2017-11-14 – 2017-11-15 (×2): 50 mg via ORAL
  Filled 2017-11-13 (×3): qty 1

## 2017-11-13 MED ORDER — HYDRALAZINE HCL 20 MG/ML IJ SOLN
10.0000 mg | Freq: Four times a day (QID) | INTRAMUSCULAR | Status: DC | PRN
  Administered 2017-11-13: 10 mg via INTRAVENOUS
  Filled 2017-11-13: qty 1

## 2017-11-13 MED ORDER — LORAZEPAM 2 MG/ML IJ SOLN
0.5000 mg | Freq: Four times a day (QID) | INTRAMUSCULAR | Status: DC | PRN
  Administered 2017-11-13: 0.5 mg via INTRAVENOUS
  Filled 2017-11-13: qty 1

## 2017-11-13 MED ORDER — MORPHINE SULFATE (PF) 2 MG/ML IV SOLN
2.0000 mg | INTRAVENOUS | Status: DC | PRN
  Administered 2017-11-13 – 2017-11-14 (×4): 2 mg via INTRAVENOUS
  Filled 2017-11-13 (×4): qty 1

## 2017-11-13 MED ORDER — MAGNESIUM SULFATE 4 GM/100ML IV SOLN
4.0000 g | Freq: Once | INTRAVENOUS | Status: AC
  Administered 2017-11-13: 4 g via INTRAVENOUS
  Filled 2017-11-13: qty 100

## 2017-11-13 MED ORDER — LORAZEPAM 1 MG PO TABS
1.0000 mg | ORAL_TABLET | Freq: Once | ORAL | Status: DC
  Filled 2017-11-13: qty 1

## 2017-11-13 MED ORDER — POTASSIUM CHLORIDE IN NACL 20-0.9 MEQ/L-% IV SOLN
Freq: Once | INTRAVENOUS | Status: AC
  Administered 2017-11-13: 09:00:00 via INTRAVENOUS
  Filled 2017-11-13: qty 1000

## 2017-11-13 NOTE — ED Notes (Signed)
Paged administrating provider about pain medication.

## 2017-11-13 NOTE — Progress Notes (Signed)
Pharmacy Antibiotic Note  Courtney Miles is a 63 y.o. female admitted on 11/13/2017 with intra-abdominal infection.  Pharmacy has been consulted for Zosyn dosing.  Plan: Zosyn 3.375g IV q8h (4 hour infusion).  Monitor clinical course, renal function, cultures as available   Dosage will likely  remain stable at above dose and need for further dosage adjustment appears unlikely at present.    Will sign off at this time.  Please reconsult if a change in clinical status warrants re-evaluation of dosage.     Height: 5\' 2"  (157.5 cm) Weight: 139 lb 15.9 oz (63.5 kg) IBW/kg (Calculated) : 50.1  Temp (24hrs), Avg:98.1 F (36.7 C), Min:97.6 F (36.4 C), Max:98.6 F (37 C)  Recent Labs  Lab 11/13/17 0548 11/13/17 0549 11/13/17 0611  WBC 15.2*  --   --   CREATININE  --  0.59 0.50    Estimated Creatinine Clearance: 62.2 mL/min (by C-G formula based on SCr of 0.5 mg/dL).    Allergies  Allergen Reactions  . Sulfa Antibiotics Other (See Comments)    Turned bright red     Antimicrobials this admission: 10/6 Zosyn >>       Thank you for allowing pharmacy to be a part of this patient's care.   Adalberto Cole, PharmD, BCPS Pager [phone] 11/13/2017 8:31 PM

## 2017-11-13 NOTE — ED Notes (Signed)
Report called to Selena Batten, RN, 4th floor.

## 2017-11-13 NOTE — ED Provider Notes (Signed)
[facility]-EMERGENCY DEPT Provider Note   CSN: [id number] Arrival date & time: 11/13/17  0522     History   Chief Complaint Chief Complaint  Patient presents with  . Emesis  . Abdominal Pain    HPI ALOISE COPUS is a 62 y.o. female.  Patient is a 62 year old female with a history of cyclical vomiting syndrome and hypertension who presents with abdominal pain and vomiting.  She states that she has been hurting since about 10:00 last night.  She has upper abdominal pain associated with nonbilious, nonbloody emesis.  She denies any known fevers.  No urinary symptoms.  She was given fentanyl and Zofran in route by EMS and feels a little bit better after this but still states that she is having significant abdominal pain.  Her symptoms are similar to her prior episodes.  She also states that she has a gastric ulcer.     Past Medical History:  Diagnosis Date  . Arthritis   . Depression   . Gastroenteritis   . Hypertension     Patient Active Problem List   Diagnosis Date Noted  . Acute metabolic encephalopathy 09/13/2017  . Colitis 09/13/2017  . Overweight (BMI 25.0-29.9) 09/13/2017  . Depression 09/13/2017  . Chronic diastolic CHF (congestive heart failure) (HCC) 09/13/2017  . Nausea & vomiting 04/30/2017  . Dehydration 04/30/2017  . Leukocytosis 04/30/2017  . Hypokalemia 04/30/2017  . Prolonged QT interval 04/30/2017  . Post-ictal state (HCC) 04/30/2017  . Gastroenteritis   . Altered mental status 04/02/2012  . Seizure disorder (HCC) 03/31/2012  . Abnormal EKG 03/31/2012  . 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 HTN (hypertension) 03/31/2012  . Hyponatremia 03/31/2012  . Viral illness 03/31/2012    History reviewed. No pertinent surgical history.   OB History   None      Home Medications    Prior to Admission medications   Medication Sig Start Date End Date Taking? Authorizing Provider  Cholecalciferol (VITAMIN D) 2000 UNITS tablet Take  4,000 Units by mouth daily.    [provider]  DULERA 100-5 MCG/ACT AERO Take 2 puffs by mouth 2 (two) times daily. 04/20/17   [provider]  escitalopram (LEXAPRO) 10 MG tablet Take 10 mg by mouth daily after breakfast. 02/27/17   [provider]  losartan (COZAAR) 25 MG tablet Take 1 tablet (25 mg total) by mouth daily. 05/05/17   Regalado, Belkys A, MD  metoprolol succinate (TOPROL-XL) 50 MG 24 hr tablet Take 50 mg by mouth every evening. Take with or immediately following a meal.    [provider]  pantoprazole (PROTONIX) 40 MG tablet Take 1 tablet (40 mg total) by mouth daily. 09/15/17   Burnadette Pop, MD  promethazine (PHENERGAN) 25 MG suppository Place 25 mg rectally every 6 (six) hours as needed for nausea/vomiting. 09/11/17   [provider]  promethazine (PHENERGAN) 25 MG tablet Take 25 mg by mouth every 6 (six) hours as needed for nausea. 06/13/17   [provider]    Family History Family History  Problem Relation Age of Onset  . Angina Mother   . Heart attack Mother   . Diabetic kidney disease Sister   . Seizures Sister     Social History Social History   Tobacco Use  . Smoking status: Former Games developer  . Smokeless tobacco: Never Used  Substance Use Topics  . Alcohol use: Yes    Alcohol/week: 1.0 standard drinks    Types: 1 Standard drinks or equivalent  per week  . Drug use: Yes    Frequency: 7.0 times per week    Types: Marijuana     Allergies   Sulfa antibiotics   Review of Systems Review of Systems  Constitutional: Negative for chills, diaphoresis, fatigue and fever.  HENT: Negative for congestion, rhinorrhea and sneezing.   Eyes: Negative.   Respiratory: Negative for cough, chest tightness and shortness of breath.   Cardiovascular: Negative for chest pain and leg swelling.  Gastrointestinal: Positive for abdominal pain, nausea and vomiting. Negative for blood in stool and diarrhea.  Genitourinary: Negative  for difficulty urinating, flank pain, frequency and hematuria.  Musculoskeletal: Negative for arthralgias and back pain.  Skin: Negative for rash.  Neurological: Negative for dizziness, speech difficulty, weakness, numbness and headaches.     Physical Exam Updated Vital Signs BP (!) 207/112   Pulse 73   Temp 97.6 F (36.4 C) (Oral)   Resp 16   Ht 5\' 2"  (1.575 m)   Wt 63.5 kg   SpO2 97%   BMI 25.60 kg/m   Physical Exam  Constitutional: She is oriented to person, place, and time. She appears well-developed and well-nourished.  HENT:  Head: Normocephalic and atraumatic.  Eyes: Pupils are equal, round, and reactive to light.  Neck: Normal range of motion. Neck supple.  Cardiovascular: Normal rate, regular rhythm and normal heart sounds.  Pulmonary/Chest: Effort normal and breath sounds normal. No respiratory distress. She has no wheezes. She has no rales. She exhibits no tenderness.  Abdominal: Soft. Bowel sounds are normal. There is tenderness in the epigastric area. There is no rebound and no guarding.  Musculoskeletal: Normal range of motion. She exhibits no edema.  Lymphadenopathy:    She has no cervical adenopathy.  Neurological: She is alert and oriented to person, place, and time.  Skin: Skin is warm and dry. No rash noted.  Psychiatric: She has a normal mood and affect.     ED Treatments / Results  Labs (all labs ordered are listed, but only abnormal results are displayed) Labs Reviewed  URINALYSIS, ROUTINE W REFLEX MICROSCOPIC - Abnormal; Notable for the following components:      Result Value   Color, Urine STRAW (*)    pH 9.0 (*)    Glucose, UA >=500 (*)    Hgb urine dipstick SMALL (*)    Ketones, ur 20 (*)    Protein, ur 30 (*)    All other components within normal limits  CBC WITH DIFFERENTIAL/PLATELET - Abnormal; Notable for the following components:   WBC 15.2 (*)    Neutro Abs 13.2 (*)    All other components within normal limits  MAGNESIUM -  Abnormal; Notable for the following components:   Magnesium 1.6 (*)    All other components within normal limits  RAPID URINE DRUG SCREEN, HOSP PERFORMED - Abnormal; Notable for the following components:   Tetrahydrocannabinol POSITIVE (*)    All other components within normal limits  COMPREHENSIVE METABOLIC PANEL - Abnormal; Notable for the following components:   Potassium 2.9 (*)    CO2 18 (*)    Glucose, Bld 167 (*)    Total Protein 8.5 (*)    Anion gap 18 (*)    All other components within normal limits  I-STAT CHEM 8, ED - Abnormal; Notable for the following components:   Potassium 2.8 (*)    Glucose, Bld 172 (*)    Calcium, Ion 0.99 (*)    TCO2 21 (*)    Hemoglobin  15.3 (*)    All other components within normal limits  LIPASE, BLOOD  CBC  TROPONIN I    EKG EKG Interpretation  Date/Time:  Sunday November 13 2017 06:03:28 EDT Ventricular Rate:  63 PR Interval:    QRS Duration: 95 QT Interval:  481 QTC Calculation: 493 R Axis:   -51 Text Interpretation:  Sinus rhythm Probable left atrial enlargement Left anterior fascicular block Abnormal R-wave progression, early transition Left ventricular hypertrophy Borderline prolonged QT interval Baseline wander in lead(s) II V2 since last tracing no significant change Confirmed by Rolan Bucco [phone]) on 11/13/2017 7:11:33 AM Also confirmed by Rolan Bucco [phone]), editor Elita Quick ([id number])  on 11/13/2017 8:54:37 AM   Radiology No results found.  Procedures Procedures (including critical care time)  Medications Ordered in ED Medications  LORazepam (ATIVAN) tablet 1 mg (1 mg Oral Not Given 11/13/17 1234)  potassium chloride SA (K-DUR,KLOR-CON) CR tablet 40 mEq (40 mEq Oral Refused 11/13/17 1338)  promethazine (PHENERGAN) injection 12.5 mg (12.5 mg Intravenous Given 11/13/17 0728)  morphine 4 MG/ML injection 4 mg (4 mg Intravenous Given 11/13/17 0721)  sodium chloride 0.9 % bolus 1,000 mL (0 mLs Intravenous Stopped  11/13/17 0831)  magnesium sulfate IVPB 1 g 100 mL (0 g Intravenous Stopped 11/13/17 0942)  0.9 % NaCl with KCl 20 mEq/ L  infusion ( Intravenous Stopped 11/13/17 1226)  promethazine (PHENERGAN) injection 12.5 mg (12.5 mg Intravenous Given 11/13/17 0908)  morphine 4 MG/ML injection 4 mg (4 mg Intravenous Given 11/13/17 0927)     Initial Impression / Assessment and Plan / ED Course  I have reviewed the triage vital signs and the nursing notes.  Pertinent labs & imaging results that were available during my care of the patient were reviewed by me and considered in my medical decision making (see chart for details).    Patient is a 62 year old female who presents with vomiting abdominal pain which is similar to her prior episodes of cyclical vomiting syndrome.  She has a marked hypokalemia.  She was given potassium replacement here although she did not tolerate oral potassium.  She also has a slightly low magnesium which was replaced in the ED.  She was given IV fluids, pain medicines and antiemetics.  She states that she is not really feeling any better and requests hospitalization.  I spoke with the hospitalist who will admit the patient.  Her blood pressure has been elevated.  She states that she stopped taking her medication about 2 weeks ago because she has not been back to see her PCP for refill.   Final Clinical Impressions(s) / ED Diagnoses   Final diagnoses:  Cyclical vomiting syndrome  Hypokalemia    ED Discharge Orders    None       Rolan Bucco, MD 11/13/17 1520

## 2017-11-13 NOTE — ED Notes (Signed)
Pt's husband came to the nursing station and asked "I know you can't give her any pain medication yet, but can you give her any nausea medication"

## 2017-11-13 NOTE — ED Notes (Signed)
ED TO INPATIENT HANDOFF REPORT  Name/Age/Gender Courtney Miles 62 y.o. female  Code Status    Code Status Orders  (From admission, onward)         Start     Ordered   11/13/17 1431  Full code  Continuous     11/13/17 1430        Code Status History    Date Active Date Inactive Code Status Order ID Comments User Context   09/13/2017 1004 09/14/2017 1947 Full Code [id number]  Annita Brod, MD ED   04/30/2017 2303 05/04/2017 1744 Full Code [id number]  Toy Baker, MD Inpatient   03/31/2012 1354 04/04/2012 1755 Full Code [id number]  Nita Sells, MD Inpatient      Home/SNF/Other Home  Chief Complaint abd pain  Level of Care/Admitting Diagnosis ED Disposition    ED Disposition Condition [facility] Area: [facility] [[id number]]  Level of Care: Telemetry [5]  Admit to tele based on following criteria: Monitor for Ischemic changes  Diagnosis: Nausea & vomiting [371062]  Admitting Physician: Kayleen Memos [[id number]]  Attending Physician: Kayleen Memos [[id number]]  Estimated length of stay: past midnight tomorrow  Certification:: I certify this patient will need inpatient services for at least 2 midnights  PT Class (Do Not Modify): Inpatient [101]  PT Acc Code (Do Not Modify): Private [1]       Medical History Past Medical History:  Diagnosis Date  . Arthritis   . Depression   . Gastroenteritis   . Hypertension     Allergies Allergies  Allergen Reactions  . Sulfa Antibiotics Other (See Comments)    Turned bright red     IV Location/Drains/Wounds Patient Lines/Drains/Airways Status   Active Line/Drains/Airways    Name:   Placement date:   Placement time:   Site:   Days:   Peripheral IV 11/13/17 Right Antecubital   11/13/17    0523    Antecubital   less than 1          Labs/Imaging Results for orders placed or performed during the hospital encounter of 11/13/17 (from the past 48 hour(s))  CBC with  Differential/Platelet     Status: Abnormal   Collection Time: 11/13/17  5:48 AM  Result Value Ref Range   WBC 15.2 (H) 4.0 - 10.5 K/uL   RBC 4.79 3.87 - 5.11 MIL/uL   Hemoglobin 14.2 12.0 - 15.0 g/dL   HCT 41.7 36.0 - 46.0 %   MCV 87.1 78.0 - 100.0 fL   MCH 29.6 26.0 - 34.0 pg   MCHC 34.1 30.0 - 36.0 g/dL   RDW 13.5 11.5 - 15.5 %   Platelets 318 150 - 400 K/uL   Neutrophils Relative % 87 %   Lymphocytes Relative 8 %   Monocytes Relative 5 %   Eosinophils Relative 0 %   Basophils Relative 0 %   Neutro Abs 13.2 (H) 1.7 - 7.7 K/uL   Lymphs Abs 1.2 0.7 - 4.0 K/uL   Monocytes Absolute 0.8 0.1 - 1.0 K/uL   Eosinophils Absolute 0.0 0.0 - 0.7 K/uL   Basophils Absolute 0.0 0.0 - 0.1 K/uL   Smear Review MORPHOLOGY UNREMARKABLE     Comment: Performed at [facility], [place] [address] [place], [place] [address]  Magnesium     Status: Abnormal   Collection Time: 11/13/17  5:48 AM  Result Value Ref Range   Magnesium 1.6 (L) 1.7 - 2.4 mg/dL  Comment: Performed at [facility], [place] [address], [place], [place] [address]  Comprehensive metabolic panel     Status: Abnormal   Collection Time: 11/13/17  5:49 AM  Result Value Ref Range   Sodium 138 135 - 145 mmol/L   Potassium 2.9 (L) 3.5 - 5.1 mmol/L   Chloride 102 98 - 111 mmol/L   CO2 18 (L) 22 - 32 mmol/L   Glucose, Bld 167 (H) 70 - 99 mg/dL   BUN 10 8 - 23 mg/dL   Creatinine, Ser 0.59 0.44 - 1.00 mg/dL   Calcium 9.5 8.9 - 10.3 mg/dL   Total Protein 8.5 (H) 6.5 - 8.1 g/dL   Albumin 4.9 3.5 - 5.0 g/dL   AST 24 15 - 41 U/L   ALT 18 0 - 44 U/L   Alkaline Phosphatase 62 38 - 126 U/L   Total Bilirubin 0.9 0.3 - 1.2 mg/dL   GFR calc non Af Amer >60 >60 mL/min   GFR calc Af Amer >60 >60 mL/min    Comment: (NOTE) The eGFR has been calculated using the CKD EPI equation. This calculation has not been validated in all clinical situations. eGFR's persistently <60 mL/min signify possible Chronic  Kidney Disease.    Anion gap 18 (H) 5 - 15    Comment: Performed at [facility], [place] [address] [place], [place] [address]  Lipase, blood     Status: None   Collection Time: 11/13/17  5:49 AM  Result Value Ref Range   Lipase 28 11 - 51 U/L    Comment: Performed at [facility], [place] [address], [place], [place] [address]  Urinalysis, Routine w reflex microscopic     Status: Abnormal   Collection Time: 11/13/17  6:01 AM  Result Value Ref Range   Color, Urine STRAW (A) YELLOW   APPearance CLEAR CLEAR   Specific Gravity, Urine 1.006 1.005 - 1.030   pH 9.0 (H) 5.0 - 8.0   Glucose, UA >=500 (A) NEGATIVE mg/dL   Hgb urine dipstick SMALL (A) NEGATIVE   Bilirubin Urine NEGATIVE NEGATIVE   Ketones, ur 20 (A) NEGATIVE mg/dL   Protein, ur 30 (A) NEGATIVE mg/dL   Nitrite NEGATIVE NEGATIVE   Leukocytes, UA NEGATIVE NEGATIVE   RBC / HPF 0-5 0 - 5 RBC/hpf   WBC, UA 0-5 0 - 5 WBC/hpf   Bacteria, UA NONE SEEN NONE SEEN   Mucus PRESENT     Comment: Performed at [facility], [place] [address], [place], [place] [address]  Rapid urine drug screen (hospital performed)     Status: Abnormal   Collection Time: 11/13/17  6:01 AM  Result Value Ref Range   Opiates NONE DETECTED NONE DETECTED   Cocaine NONE DETECTED NONE DETECTED   Benzodiazepines NONE DETECTED NONE DETECTED   Amphetamines NONE DETECTED NONE DETECTED   Tetrahydrocannabinol POSITIVE (A) NONE DETECTED   Barbiturates NONE DETECTED NONE DETECTED    Comment: Performed at [facility], [place] [address], [place],  [address]  I-Stat Chem 8, ED     Status: Abnormal   Collection Time: 11/13/17  6:11 AM  Result Value Ref Range   Sodium 135 135 - 145 mmol/L   Potassium 2.8 (L) 3.5 - 5.1 mmol/L   Chloride 102 98 - 111 mmol/L   BUN 8 8 - 23 mg/dL   Creatinine, Ser 0.50 0.44 - 1.00 mg/dL   Glucose, Bld 172 (H) 70 - 99 mg/dL  Calcium, Ion 0.99 (L) 1.15 - 1.40 mmol/L    TCO2 21 (L) 22 - 32 mmol/L   Hemoglobin 15.3 (H) 12.0 - 15.0 g/dL   HCT 45.0 36.0 - 46.0 %   No results found.  Pending Labs Unresulted Labs (From admission, onward)    Start     Ordered   11/13/17 1513  Troponin I  Once,   R     11/13/17 1512   11/13/17 0548  CBC  STAT,   STAT     11/13/17 0547          Vitals/Pain Today's Vitals   11/13/17 1330 11/13/17 1400 11/13/17 1434 11/13/17 1500  BP: (!) 172/82 (!) 165/80 (!) 207/112 (!) 200/97  Pulse: 76 72 73 68  Resp: '20 19 16 20  ' Temp:      TempSrc:      SpO2: 93% 96% 97% 99%  Weight:      Height:      PainSc:        Isolation Precautions No active isolations  Medications Medications  LORazepam (ATIVAN) tablet 1 mg (1 mg Oral Not Given 11/13/17 1234)  potassium chloride SA (K-DUR,KLOR-CON) CR tablet 40 mEq (40 mEq Oral Refused 11/13/17 1338)  hydrALAZINE (APRESOLINE) injection 10 mg (has no administration in time range)  enoxaparin (LOVENOX) injection 40 mg (has no administration in time range)  promethazine (PHENERGAN) injection 12.5 mg (12.5 mg Intravenous Given 11/13/17 0728)  morphine 4 MG/ML injection 4 mg (4 mg Intravenous Given 11/13/17 0721)  sodium chloride 0.9 % bolus 1,000 mL (0 mLs Intravenous Stopped 11/13/17 0831)  magnesium sulfate IVPB 1 g 100 mL (0 g Intravenous Stopped 11/13/17 0942)  0.9 % NaCl with KCl 20 mEq/ L  infusion ( Intravenous Stopped 11/13/17 1226)  promethazine (PHENERGAN) injection 12.5 mg (12.5 mg Intravenous Given 11/13/17 0908)  morphine 4 MG/ML injection 4 mg (4 mg Intravenous Given 11/13/17 2909)    Mobility walks with person assist

## 2017-11-13 NOTE — ED Notes (Signed)
Bed: ZO10 Expected date:  Expected time:  Means of arrival:  Comments: 55F abdominal pain/vomiting

## 2017-11-13 NOTE — ED Notes (Signed)
Pt requesting pain medication.  

## 2017-11-13 NOTE — ED Notes (Signed)
Urine sample and culture sent to lab 

## 2017-11-13 NOTE — H&P (Signed)
History and Physical  STOREY STANGELAND ZOX:[id number] DOB: 1955/08/08 DOA: 11/13/2017  Referring physician: Dr Fredderick Phenix  PCP: Gweneth Dimitri, MD  Outpatient Specialists: Enid Baas Patient coming from: Home  Chief Complaint: Nausea and vomiting  HPI: Courtney Miles is a 62 y.o. female with medical history significant for chronic diastolic CHF, hypertension, seizure disorder, depression, prolonged QT interval, who presented to [facility] ED with complaints of nausea and vomiting of 1 day duration.  States she was in her usual state of health until last night she ate from a local restaurant then afterwards started having nausea and vomiting and watery stools.  Nausea and vomiting has persisted in the ED.  Admits to chills.  Also endorses epigastric pain after she vomits.  Denies hematemesis, melena or hematochezia. States she has had a colonoscopy in the [place] at least 10 years ago.  Declined EGD but was told that she had gastric ulcers.  TR[facility] asked to admit.  ED Course: Upon presentation to the ED, hypertensive and tachypneic.  Lab studies remarkable for leukocytosis with WBC 15,000 and hypokalemia with potassium of 2.8.  Review of Systems: Review of systems as noted in the HPI. All other systems reviewed and are negative.   Past Medical History:  Diagnosis Date  . Arthritis   . Depression   . Gastroenteritis   . Hypertension    History reviewed. No pertinent surgical history.  Social History:  reports that she has quit smoking. She has never used smokeless tobacco. She reports that she drinks about 1.0 standard drinks of alcohol per week. She reports that she has current or past drug history. Drug: Marijuana. Frequency: 7.00 times per week.   Allergies  Allergen Reactions  . Sulfa Antibiotics Other (See Comments)    Turned bright red     Family History  Problem Relation Age of Onset  . Angina Mother   . Heart attack Mother   . Diabetic kidney disease Sister   . Seizures Sister        Prior to Admission medications   Medication Sig Start Date End Date Taking? Authorizing Provider  Cholecalciferol (VITAMIN D) 2000 UNITS tablet Take 4,000 Units by mouth daily.    [provider]  DULERA 100-5 MCG/ACT AERO Take 2 puffs by mouth 2 (two) times daily. 04/20/17   [provider]  escitalopram (LEXAPRO) 10 MG tablet Take 10 mg by mouth daily after breakfast. 02/27/17   [provider]  losartan (COZAAR) 25 MG tablet Take 1 tablet (25 mg total) by mouth daily. 05/05/17   Regalado, Belkys A, MD  metoprolol succinate (TOPROL-XL) 50 MG 24 hr tablet Take 50 mg by mouth every evening. Take with or immediately following a meal.    [provider]  pantoprazole (PROTONIX) 40 MG tablet Take 1 tablet (40 mg total) by mouth daily. 09/15/17   Burnadette Pop, MD  promethazine (PHENERGAN) 25 MG suppository Place 25 mg rectally every 6 (six) hours as needed for nausea/vomiting. 09/11/17   [provider]  promethazine (PHENERGAN) 25 MG tablet Take 25 mg by mouth every 6 (six) hours as needed for nausea. 06/13/17   [provider]    Physical Exam: BP (!) 207/112   Pulse 73   Temp 97.6 F (36.4 C) (Oral)   Resp 16   Ht 5\' 2"  (1.575 m)   Wt 63.5 kg   SpO2 97%   BMI 25.60 kg/m   . General: 62 y.o. year-old female well developed well nourished, appears uncomfortable  due to intractable nausea and vomiting.  Alert and oriented x3. . Cardiovascular: Regular rate and rhythm with no rubs or gallops.  No thyromegaly or JVD noted.  No lower extremity edema. 2/4 pulses in all 4 extremities. Marland Kitchen Respiratory: Clear to auscultation with no wheezes or rales. Good inspiratory effort. . Abdomen: Soft tenderness on palpation epigastric region.  Nondistended with normal bowel sounds x4 quadrants. . Muskuloskeletal: No cyanosis, clubbing or edema noted bilaterally . Neuro: CN II-XII intact, strength, sensation, reflexes . Skin: No ulcerative lesions noted or  rashes . Psychiatry: Judgement and insight appear normal. Mood is appropriate for condition and setting          Labs on Admission:  Basic Metabolic Panel: Recent Labs  Lab 11/13/17 0548 11/13/17 0549 11/13/17 0611  NA  --  138 135  K  --  2.9* 2.8*  CL  --  102 102  CO2  --  18*  --   GLUCOSE  --  167* 172*  BUN  --  10 8  CREATININE  --  0.59 0.50  CALCIUM  --  9.5  --   MG 1.6*  --   --    Liver Function Tests: Recent Labs  Lab 11/13/17 0549  AST 24  ALT 18  ALKPHOS 62  BILITOT 0.9  PROT 8.5*  ALBUMIN 4.9   Recent Labs  Lab 11/13/17 0549  LIPASE 28   No results for input(s): AMMONIA in the last 168 hours. CBC: Recent Labs  Lab 11/13/17 0548 11/13/17 0611  WBC 15.2*  --   NEUTROABS 13.2*  --   HGB 14.2 15.3*  HCT 41.7 45.0  MCV 87.1  --   PLT 318  --    Cardiac Enzymes: No results for input(s): CKTOTAL, CKMB, CKMBINDEX, TROPONINI in the last 168 hours.  BNP (last 3 results) No results for input(s): BNP in the last 8760 hours.  ProBNP (last 3 results) No results for input(s): PROBNP in the last 8760 hours.  CBG: No results for input(s): GLUCAP in the last 168 hours.  Radiological Exams on Admission: No results found.  EKG: I independently viewed the EKG done and my findings are as followed: Sinus rhythm with rate of 63 and prolonged QTC 493.  Assessment/Plan Present on Admission: . Nausea & vomiting  Active Problems:   Nausea & vomiting  Intractable nausea and vomiting of unclear etiology Possibly gastroenteritis versus others Lipase unremarkable UA unremarkable Obtain troponin x1 Last CT abdomen and pelvis with contrast done on 09/13/2017 revealed mild wall thickening and soft tissue inflammation about the entirety of the colon concerning for diffuse infectious or inflammatory colitis Consult GI in the morning Treat symptomatically with antiemetic and IV fluids Start IV Protonix 40 mg twice daily N.p.o.  Sirs criteria with unclear  source of infection Obtain CT abdomen and pelvis with contrast Obtain lactic acid and procalcitonin Treat empirically with IV Zosyn  Hypokalemia Suspect secondary to GI losses Potassium 2.8 Replete with IV KCl 10 mEq x 6 Repeat BMP in the morning  Hypomagnesemia Magnesium 1.6 Replete with 4 g IV magnesium  History of seizures Not on antiseizure medications Continue close monitoring on telemetry  Chronic depression/anxiety Continue escitalopram  Uncontrolled hypertension Resume home antihypertensive medications Add IV hydralazine 10 mg every 6 hours PRN for systolic blood pressure greater than 180 or diastolic blood pressure greater than 105  GERD Start IV Protonix 40 mg twice daily  QTC prolongation Independently reviewed twelve-lead EKG revealed QTC 493 Avoid  QTC prolonging agents Repeat twelve-lead EKG tomorrow morning   Risks: High risk for decompensation due to persistent nausea and vomiting with poor oral intake, concern for dehydration, significant electrolyte imbalance, multiple comorbidities, and advanced age.  DVT prophylaxis: Subcu Lovenox daily  Code Status: Full code  Family Communication: None at bedside  Disposition Plan: Admit to telemetry  Consults called: None; please consult GI in the morning  Admission status: Inpatient status    Darlin Drop MD [facility] Pager [phone]  If 7PM-7AM, please contact night-coverage [email or web address] Password [email or web address]  11/13/2017, 3:20 PM

## 2017-11-13 NOTE — ED Notes (Signed)
Pharmacy called, Mag and KCl compatible.

## 2017-11-13 NOTE — ED Triage Notes (Signed)
Per EMS, pt arriving from home with complaints of non-stop vomiting and abdominal pain since 10 PM. Pt has a hx of cyclic vomiting that results in hypokalemia and seizures. Pt tried oral and suppository phenergan before contacting EMS.   EMS gave 8 mg of Zofran and 100 mcg of Fentanyl

## 2017-11-13 NOTE — ED Notes (Signed)
Pt refused PO KCl. Said she would try again later, but she "just can't do it right now."

## 2017-11-13 NOTE — ED Notes (Signed)
Patient declined PO ativan, she did "not want to start another medication." Per MD husband was allowed to give her home Lexapro.

## 2017-11-14 ENCOUNTER — Encounter (HOSPITAL_COMMUNITY): Payer: Self-pay

## 2017-11-14 ENCOUNTER — Inpatient Hospital Stay (HOSPITAL_COMMUNITY): Payer: BLUE CROSS/BLUE SHIELD

## 2017-11-14 DIAGNOSIS — R112 Nausea with vomiting, unspecified: Secondary | ICD-10-CM

## 2017-11-14 LAB — CBC
HEMATOCRIT: 45.6 % (ref 36.0–46.0)
Hemoglobin: 15 g/dL (ref 12.0–15.0)
MCH: 29 pg (ref 26.0–34.0)
MCHC: 32.9 g/dL (ref 30.0–36.0)
MCV: 88 fL (ref 78.0–100.0)
PLATELETS: 319 10*3/uL (ref 150–400)
RBC: 5.18 MIL/uL — AB (ref 3.87–5.11)
RDW: 13.9 % (ref 11.5–15.5)
WBC: 13.7 10*3/uL — ABNORMAL HIGH (ref 4.0–10.5)

## 2017-11-14 LAB — BASIC METABOLIC PANEL
ANION GAP: 16 — AB (ref 5–15)
BUN: 13 mg/dL (ref 8–23)
CO2: 20 mmol/L — AB (ref 22–32)
Calcium: 8.7 mg/dL — ABNORMAL LOW (ref 8.9–10.3)
Chloride: 98 mmol/L (ref 98–111)
Creatinine, Ser: 0.84 mg/dL (ref 0.44–1.00)
GFR calc Af Amer: >60 mL/min (ref >60)
GLUCOSE: 129 mg/dL — AB (ref 70–99)
Potassium: 3 mmol/L — ABNORMAL LOW (ref 3.5–5.1)
Sodium: 134 mmol/L — ABNORMAL LOW (ref 135–145)

## 2017-11-14 LAB — MAGNESIUM: Magnesium: 3.1 mg/dL — ABNORMAL HIGH (ref 1.7–2.4)

## 2017-11-14 LAB — PROCALCITONIN

## 2017-11-14 LAB — PHOSPHORUS: Phosphorus: 3.9 mg/dL (ref 2.5–4.6)

## 2017-11-14 MED ORDER — IOHEXOL 300 MG/ML  SOLN
100.0000 mL | Freq: Once | INTRAMUSCULAR | Status: AC | PRN
  Administered 2017-11-14: 100 mL via INTRAVENOUS

## 2017-11-14 MED ORDER — IOPAMIDOL (ISOVUE-300) INJECTION 61%
INTRAVENOUS | Status: AC
  Administered 2017-11-14: 15 mL via ORAL
  Filled 2017-11-14: qty 30

## 2017-11-14 MED ORDER — PROMETHAZINE HCL 25 MG/ML IJ SOLN
12.5000 mg | Freq: Four times a day (QID) | INTRAMUSCULAR | Status: DC | PRN
  Administered 2017-11-14: 12.5 mg via INTRAVENOUS
  Filled 2017-11-14: qty 1

## 2017-11-14 MED ORDER — SODIUM CHLORIDE 0.9 % IJ SOLN
INTRAMUSCULAR | Status: AC
  Filled 2017-11-14: qty 50

## 2017-11-14 MED ORDER — PROMETHAZINE HCL 25 MG/ML IJ SOLN: 12.5000 mg | Freq: Four times a day (QID) | INTRAMUSCULAR | Status: DC | PRN

## 2017-11-14 MED ORDER — POTASSIUM CHLORIDE 10 MEQ/100ML IV SOLN
10.0000 meq | INTRAVENOUS | Status: AC
  Administered 2017-11-14 (×3): 10 meq via INTRAVENOUS
  Filled 2017-11-14 (×3): qty 100

## 2017-11-14 MED ORDER — IOPAMIDOL (ISOVUE-300) INJECTION 61%
15.0000 mL | Freq: Once | INTRAVENOUS | Status: DC | PRN
  Administered 2017-11-14: 15 mL via ORAL
  Filled 2017-11-14: qty 30

## 2017-11-14 MED ORDER — HYDRALAZINE HCL 20 MG/ML IJ SOLN: 5.0000 mg | Freq: Four times a day (QID) | INTRAMUSCULAR | Status: DC | PRN

## 2017-11-14 NOTE — Progress Notes (Signed)
Patient refused anything by mouth, PO contrast and medication because she is still nauseous, encouraged  Patient to try but patient stated that she does not want any scan done tonight but she is willing to try tomorrow.  Oncall provider- C. Bodenheimer-NP informed.

## 2017-11-14 NOTE — Progress Notes (Signed)
PROGRESS NOTE    [name]  GNF:[id number] DOB: [birth year] DOA: 11/13/2017 PCP: Gweneth Dimitri, MD   Brief Narrative: Courtney Miles is a 62 y.o. female with medical history significant for chronic diastolic CHF, hypertension, seizure disorder, depression, prolonged QT interval, who presented to [facility] ED with complaints of nausea and vomiting of 1 day duration.  States she was in her usual state of health until the night before admission,  she ate from a local restaurant then afterwards started having nausea and vomiting and watery stools. Upon presentation to the ED, hypertensive and tachypneic.  Lab studies remarkable for leukocytosis with WBC 15,000 and hypokalemia with potassium of 2.8 She was admitted for further evaluation.   Assessment & Plan:   Active Problems:   Nausea & vomiting   Nausea, vomiting and loose diarrhea associated with abdominal pain.  Suspect gastroenteritis.  CT abd and pelvis ordered.  Her symptoms improved, but not resolved.  Symptomatic management with IV fluids, IV anti emetics and IV pain control.  Send GI pathogen for pcr.  Continue with protonix. Start her on clears and monitor for recurrent of symptoms or worsening symptoms.  If her symptoms worsen, recommend GI consult in am.    SIRS:  Improving. No source of infection found so far. Empirically treating with IV zosyn. If CT abd does not show any enteritis, can d./c anti biotics.    Hypokalemia:  Replaced. Repeat in am.    Hypertension:  Sub optimal, resume losartan and metoprolol.    Hypomagnesemia: replaced.   DVT prophylaxis: lovenox.  Code Status:  Full code.  Family Communication: none at bedside.  Disposition Plan: pending clinical improvement.    Consultants:   None.    Procedures: CT abd and pelvis.    Antimicrobials: none.    Subjective: Reports her nausea, improved, but not resolved.  Some abdominal discomfort , not resolved.  No vomiting. No chest pain or  sob.    Objective: Vitals:   11/14/17 0500 11/14/17 0600 11/14/17 1006 11/14/17 1335  BP:  90/68 111/72 (!) 147/88  Pulse: 76  74 70  Resp: 18     Temp: 98 F (36.7 C)   99.1 F (37.3 C)  TempSrc: Oral   Oral  SpO2: 95%   98%  Weight:      Height:        Intake/Output Summary (Last 24 hours) at 11/14/2017 1529 Last data filed at 11/14/2017 1500 Gross per 24 hour  Intake 1748.8 ml  Output 1450 ml  Net 298.8 ml   Filed Weights   11/13/17 0608  Weight: 63.5 kg    Examination:  General exam: Appears calm and comfortable,not in distress.  Respiratory system: Clear to auscultation. Respiratory effort normal. No wheezing or rhonchi.  Cardiovascular system: S1 & S2 heard, RRR. No JVD, murmer present. . No pedal edema. Gastrointestinal system: Abdomen is nondistended, soft , mild tenderness generalized.  Central nervous system: Alert and oriented. No focal neurological deficits. Extremities: Symmetric 5 x 5 power. Skin: No rashes, lesions or ulcers Psychiatry:  Mood & affect appropriate.     Data Reviewed: I have personally reviewed following labs and imaging studies  CBC: Recent Labs  Lab 11/13/17 0548 11/13/17 0611 11/14/17 0422  WBC 15.2*  --  13.7*  NEUTROABS 13.2*  --   --   HGB 14.2 15.3* 15.0  HCT 41.7 45.0 45.6  MCV 87.1  --  88.0  PLT 318  --  319   Basic Metabolic  Panel: Recent Labs  Lab 11/13/17 0548 11/13/17 0549 11/13/17 0611 11/13/17 1753 11/14/17 0422  NA  --  138 135  --  134*  K  --  2.9* 2.8* 5.1 3.0*  CL  --  102 102  --  98  CO2  --  18*  --   --  20*  GLUCOSE  --  167* 172*  --  129*  BUN  --  10 8  --  13  CREATININE  --  0.59 0.50  --  0.84  CALCIUM  --  9.5  --   --  8.7*  MG 1.6*  --   --   --  3.1*  PHOS  --   --   --   --  3.9   GFR: Estimated Creatinine Clearance: 59.3 mL/min (by C-G formula based on SCr of 0.84 mg/dL). Liver Function Tests: Recent Labs  Lab 11/13/17 0549  AST 24  ALT 18  ALKPHOS 62  BILITOT 0.9    PROT 8.5*  ALBUMIN 4.9   Recent Labs  Lab 11/13/17 0549  LIPASE 28   No results for input(s): AMMONIA in the last 168 hours. Coagulation Profile: No results for input(s): INR, PROTIME in the last 168 hours. Cardiac Enzymes: Recent Labs  Lab 11/13/17 1525  TROPONINI <0.03   BNP (last 3 results) No results for input(s): PROBNP in the last 8760 hours. HbA1C: No results for input(s): HGBA1C in the last 72 hours. CBG: No results for input(s): GLUCAP in the last 168 hours. Lipid Profile: No results for input(s): CHOL, HDL, LDLCALC, TRIG, CHOLHDL, LDLDIRECT in the last 72 hours. Thyroid Function Tests: No results for input(s): TSH, T4TOTAL, FREET4, T3FREE, THYROIDAB in the last 72 hours. Anemia Panel: No results for input(s): VITAMINB12, FOLATE, FERRITIN, TIBC, IRON, RETICCTPCT in the last 72 hours. Sepsis Labs: Recent Labs  Lab 11/14/17 0422  PROCALCITON <0.10    No results found for this or any previous visit (from the past 240 hour(s)).       Radiology Studies: Ct Abdomen Pelvis W Contrast  Result Date: 11/14/2017 CLINICAL DATA:  Acute abdominal pain, epigastric pain with nausea, vomiting, and diarrhea after eating on Saturday evening, history of kidney stones, hypertension, gastroenteritis, former smoker EXAM: CT ABDOMEN AND PELVIS WITH CONTRAST TECHNIQUE: Multidetector CT imaging of the abdomen and pelvis was performed using the standard protocol following bolus administration of intravenous contrast. Sagittal and coronal MPR images reconstructed from axial data set. CONTRAST:  OMNIPAQUE IOHEXOL 300 MG/ML SOLN IV. Dilute oral contrast. COMPARISON:  09/13/2017 FINDINGS: Lower chest: Lung bases clear Hepatobiliary: Focal fatty infiltration of liver adjacent to falciform fissure. Gallbladder and liver otherwise normal appearance Pancreas: Normal appearance Spleen: Normal appearance.  Small splenule inferior to spleen Adrenals/Urinary Tract: Small BILATERAL renal cysts.  6 x 2 mm nonobstructing calculus LEFT kidney. Adrenal glands, kidneys, ureters, and bladder otherwise normal. Stomach/Bowel: Questionable wall thickening of the distal gastric antrum versus artifact from underdistention, unable to exclude focal gastritis. No gross gastric mass or ulcer. Minimal distal colonic diverticulosis. Normal appendix, retrocecal. Bowel loops otherwise unremarkable. Vascular/Lymphatic: Minimal atherosclerotic calcification aorta without aneurysm. Retroaortic LEFT renal vein. Vascular structures patent. No adenopathy. Reproductive: Nodular appearing uterus likely containing multiple small leiomyomata, largest discrete nodule 2.3 cm diameter. Unremarkable ovaries and adnexa. Other: No free air or free fluid. No hernia or acute inflammatory process. Musculoskeletal: Mild scattered degenerative disc disease changes of the thoracolumbar spine. Grade 1 anterolisthesis L4-L5 likely due to degenerative disc and  facet disease. IMPRESSION: Probable multiple small uterine leiomyomata. Questionable wall thickening of distal gastric antrum versus artifact from underdistention, unable to exclude focal gastritis. No acute intra-abdominal or intrapelvic abnormalities otherwise identified. Electronically Signed   By: Ulyses Southward M.D.   On: 11/14/2017 13:10        Scheduled Meds: . cholecalciferol  4,000 Units Oral Daily  . enoxaparin (LOVENOX) injection  40 mg Subcutaneous Q24H  . losartan  25 mg Oral Daily  . metoprolol succinate  50 mg Oral Daily  . sodium chloride       Continuous Infusions: . sodium chloride 75 mL/hr at 11/13/17 2033  . piperacillin-tazobactam (ZOSYN)  IV 3.375 g (11/14/17 1446)     LOS: 1 day    Time spent: 34 minutes.    Kathlen Mody, MD [facility] Pager [phone]  If 7PM-7AM, please contact night-coverage [email or web address] Password [email or web address] 11/14/2017, 3:29 PM

## 2017-11-15 DIAGNOSIS — K2900 Acute gastritis without bleeding: Secondary | ICD-10-CM

## 2017-11-15 LAB — GASTROINTESTINAL PANEL BY PCR, STOOL (REPLACES STOOL CULTURE)
ASTROVIRUS: NOT DETECTED
Adenovirus F40/41: NOT DETECTED
Campylobacter species: NOT DETECTED
Cryptosporidium: NOT DETECTED
Cyclospora cayetanensis: NOT DETECTED
ENTAMOEBA HISTOLYTICA: NOT DETECTED
ENTEROAGGREGATIVE E COLI (EAEC): NOT DETECTED
ENTEROTOXIGENIC E COLI (ETEC): NOT DETECTED
Enteropathogenic E coli (EPEC): NOT DETECTED
Giardia lamblia: NOT DETECTED
NOROVIRUS GI/GII: NOT DETECTED
Plesimonas shigelloides: NOT DETECTED
ROTAVIRUS A: NOT DETECTED
SHIGA LIKE TOXIN PRODUCING E COLI (STEC): NOT DETECTED
Salmonella species: NOT DETECTED
Sapovirus (I, II, IV, and V): NOT DETECTED
Shigella/Enteroinvasive E coli (EIEC): NOT DETECTED
Vibrio cholerae: NOT DETECTED
Vibrio species: NOT DETECTED
Yersinia enterocolitica: NOT DETECTED

## 2017-11-15 MED ORDER — ESCITALOPRAM OXALATE 10 MG PO TABS
10.0000 mg | ORAL_TABLET | Freq: Every day | ORAL | Status: DC
  Administered 2017-11-15: 10 mg via ORAL
  Filled 2017-11-15: qty 1

## 2017-11-15 MED ORDER — LOSARTAN POTASSIUM 25 MG PO TABS: 25.0000 mg | ORAL_TABLET | Freq: Every day | ORAL | 0 refills | Status: DC

## 2017-11-15 MED ORDER — PANTOPRAZOLE SODIUM 40 MG PO TBEC: 40.0000 mg | DELAYED_RELEASE_TABLET | Freq: Every day | ORAL | 0 refills | Status: DC

## 2017-11-15 MED ORDER — POTASSIUM CHLORIDE CRYS ER 20 MEQ PO TBCR: 60.0000 meq | EXTENDED_RELEASE_TABLET | Freq: Once | ORAL | Status: DC

## 2017-11-15 NOTE — Discharge Summary (Signed)
Physician Discharge Summary  Courtney Miles  ZHY:[id number]  DOB: Apr 16, 1955  DOA: 11/13/2017 PCP: Gweneth Dimitri, MD  Admit date: 11/13/2017 Discharge date: 11/15/2017  Admitted From: Home  Disposition:  Home   Recommendations for Outpatient Follow-up:  1. Follow up with PCP in 1-2 weeks 2. Follow up with GI, may need EGD  3. Please obtain BMP/CBC in one week to monitor electrolytes and hemoglobin. 4. Please follow up on the following pending results: GI panel.  Discharge Condition: Stable CODE STATUS: Full code Diet recommendation: Heart Healthy    Brief/Interim Summary: For full details see H&P/Progress note, but in brief, Courtney Miles is a 62 year old female with medical history chronic diastolic CHF, hypertension, seizure disorder, depression presented to the emergency department complaining of nausea, vomiting and diarrhea for 1 day prior to admission.  Upon ED evaluation patient was found to be hypertensive, tachypneic and labs unremarkable with WBC of 15,000, along with hypokalemia.  Patient was admitted with working diagnosis of suspected gastroenteritis.  Subjective: Patient seen and examined, she is able to tolerate diet well.  Denies diarrhea.  No nausea or vomiting.  No acute events overnight.  Remains afebrile.  Discharge Diagnoses/Hospital Course:  Gastritis/viral gastroenteritis Diarrhea, nausea and vomiting has resolved.  CT of the abdomen shows no acute abdomen. GI panel pending, however doubt infectious etiology. Patient was treated with Zosyn will discontinue and CT does not show signs of enteritis Conservative management with IV fluids, antiemetic and pain control was done during hospital stay.  Will start on omeprazole 40 mg daily and advised to follow-up with GI for possible EGD.  SIRS Improved, no source of infection found, ?  Gastroenteritis.  Initially treated empirically with IV Zosyn which was discontinued last CT did not show evidence of active  infection.  Hypokalemia Replaced, check BMP in 1 week  Hypertension BP improved, continue losartan metoprolol  Hypomagnesemia Resolved, check BMP in 1 week.  All other chronic medical condition were stable during the hospitalization.  On the day of the discharge the patient's vitals were stable, and no other acute medical condition were reported by patient. the patient was felt safe to be discharge to home.   Discharge Instructions  You were cared for by a hospitalist during your hospital stay. If you have any questions about your discharge medications or the care you received while you were in the hospital after you are discharged, you can call the unit and asked to speak with the hospitalist on call if the hospitalist that took care of you is not available. Once you are discharged, your primary care physician will handle any further medical issues. Please note that NO REFILLS for any discharge medications will be authorized once you are discharged, as it is imperative that you return to your primary care physician (or establish a relationship with a primary care physician if you do not have one) for your aftercare needs so that they can reassess your need for medications and monitor your lab values.  Discharge Instructions    Call MD for:  difficulty breathing, headache or visual disturbances   Complete by:  As directed    Call MD for:  extreme fatigue   Complete by:  As directed    Call MD for:  hives   Complete by:  As directed    Call MD for:  persistant dizziness or light-headedness   Complete by:  As directed    Call MD for:  persistant nausea and vomiting   Complete by:  As directed    Call MD for:  redness, tenderness, or signs of infection (pain, swelling, redness, odor or green/yellow discharge around incision site)   Complete by:  As directed    Call MD for:  severe uncontrolled pain   Complete by:  As directed    Call MD for:  temperature >100.4   Complete by:  As  directed    Diet - low sodium heart healthy   Complete by:  As directed    Increase activity slowly   Complete by:  As directed      Allergies as of 11/15/2017      Reactions   Sulfa Antibiotics Other (See Comments)   Turned bright red      Medication List    TAKE these medications   DULERA 100-5 MCG/ACT Aero Generic drug:  mometasone-formoterol Take 2 puffs by mouth 2 (two) times daily.   escitalopram 10 MG tablet Commonly known as:  LEXAPRO Take 10 mg by mouth daily after breakfast.   losartan 25 MG tablet Commonly known as:  COZAAR Take 1 tablet (25 mg total) by mouth daily.   metoprolol succinate 50 MG 24 hr tablet Commonly known as:  TOPROL-XL Take 50 mg by mouth daily. Take with or immediately following a meal.   pantoprazole 40 MG tablet Commonly known as:  PROTONIX Take 1 tablet (40 mg total) by mouth daily.   promethazine 25 MG suppository Commonly known as:  PHENERGAN Place 25 mg rectally every 6 (six) hours as needed for nausea/vomiting. What changed:  Another medication with the same name was removed. Continue taking this medication, and follow the directions you see here.   Vitamin D 2000 units tablet Take 4,000 Units by mouth daily.      Follow-up Information    Gweneth Dimitri, MD. Schedule an appointment as soon as possible for a visit in 1 week(s).   Specialty:  Family Medicine Why:  Hospital follow up  Contact information: [address] [place] [address] [phone]          Allergies  Allergen Reactions  . Sulfa Antibiotics Other (See Comments)    Turned bright red     Consultations:     Procedures/Studies: Ct Abdomen Pelvis W Contrast  Result Date: 11/14/2017 CLINICAL DATA:  Acute abdominal pain, epigastric pain with nausea, vomiting, and diarrhea after eating on Saturday evening, history of kidney stones, hypertension, gastroenteritis, former smoker EXAM: CT ABDOMEN AND PELVIS WITH CONTRAST TECHNIQUE:  Multidetector CT imaging of the abdomen and pelvis was performed using the standard protocol following bolus administration of intravenous contrast. Sagittal and coronal MPR images reconstructed from axial data set. CONTRAST:  OMNIPAQUE IOHEXOL 300 MG/ML SOLN IV. Dilute oral contrast. COMPARISON:  09/13/2017 FINDINGS: Lower chest: Lung bases clear Hepatobiliary: Focal fatty infiltration of liver adjacent to falciform fissure. Gallbladder and liver otherwise normal appearance Pancreas: Normal appearance Spleen: Normal appearance.  Small splenule inferior to spleen Adrenals/Urinary Tract: Small BILATERAL renal cysts. 6 x 2 mm nonobstructing calculus LEFT kidney. Adrenal glands, kidneys, ureters, and bladder otherwise normal. Stomach/Bowel: Questionable wall thickening of the distal gastric antrum versus artifact from underdistention, unable to exclude focal gastritis. No gross gastric mass or ulcer. Minimal distal colonic diverticulosis. Normal appendix, retrocecal. Bowel loops otherwise unremarkable. Vascular/Lymphatic: Minimal atherosclerotic calcification aorta without aneurysm. Retroaortic LEFT renal vein. Vascular structures patent. No adenopathy. Reproductive: Nodular appearing uterus likely containing multiple small leiomyomata, largest discrete nodule 2.3 cm diameter. Unremarkable ovaries and adnexa. Other: No free  air or free fluid. No hernia or acute inflammatory process. Musculoskeletal: Mild scattered degenerative disc disease changes of the thoracolumbar spine. Grade 1 anterolisthesis L4-L5 likely due to degenerative disc and facet disease. IMPRESSION: Probable multiple small uterine leiomyomata. Questionable wall thickening of distal gastric antrum versus artifact from underdistention, unable to exclude focal gastritis. No acute intra-abdominal or intrapelvic abnormalities otherwise identified. Electronically Signed   By: Ulyses Southward M.D.   On: 11/14/2017 13:10    Discharge Exam: Vitals:    11/14/17 2048 11/15/17 0516  BP: 129/84 118/73  Pulse: 70 65  Resp: 16 16  Temp: 98.4 F (36.9 C) 98.2 F (36.8 C)  SpO2: 98% 97%   Vitals:   11/14/17 1006 11/14/17 1335 11/14/17 2048 11/15/17 0516  BP: 111/72 (!) 147/88 129/84 118/73  Pulse: 74 70 70 65  Resp:   16 16  Temp:  99.1 F (37.3 C) 98.4 F (36.9 C) 98.2 F (36.8 C)  TempSrc:  Oral Oral Oral  SpO2:  98% 98% 97%  Weight:      Height:        General: Pt is alert, awake, not in acute distress Cardiovascular: RRR, S1/S2 +, no rubs, no gallops Respiratory: CTA bilaterally, no wheezing, no rhonchi Abdominal: Soft, NT, ND, bowel sounds + Extremities: no edema, no cyanosis   The results of significant diagnostics from this hospitalization (including imaging, microbiology, ancillary and laboratory) are listed below for reference.     Microbiology: No results found for this or any previous visit (from the past 240 hour(s)).   Labs: BNP (last 3 results) No results for input(s): BNP in the last 8760 hours. Basic Metabolic Panel: Recent Labs  Lab 11/13/17 0548 11/13/17 0549 11/13/17 0611 11/13/17 1753 11/14/17 0422  NA  --  138 135  --  134*  K  --  2.9* 2.8* 5.1 3.0*  CL  --  102 102  --  98  CO2  --  18*  --   --  20*  GLUCOSE  --  167* 172*  --  129*  BUN  --  10 8  --  13  CREATININE  --  0.59 0.50  --  0.84  CALCIUM  --  9.5  --   --  8.7*  MG 1.6*  --   --   --  3.1*  PHOS  --   --   --   --  3.9   Liver Function Tests: Recent Labs  Lab 11/13/17 0549  AST 24  ALT 18  ALKPHOS 62  BILITOT 0.9  PROT 8.5*  ALBUMIN 4.9   Recent Labs  Lab 11/13/17 0549  LIPASE 28   No results for input(s): AMMONIA in the last 168 hours. CBC: Recent Labs  Lab 11/13/17 0548 11/13/17 0611 11/14/17 0422  WBC 15.2*  --  13.7*  NEUTROABS 13.2*  --   --   HGB 14.2 15.3* 15.0  HCT 41.7 45.0 45.6  MCV 87.1  --  88.0  PLT 318  --  319   Cardiac Enzymes: Recent Labs  Lab 11/13/17 1525  TROPONINI <0.03    BNP: Invalid input(s): POCBNP CBG: No results for input(s): GLUCAP in the last 168 hours. D-Dimer No results for input(s): DDIMER in the last 72 hours. Hgb A1c No results for input(s): HGBA1C in the last 72 hours. Lipid Profile No results for input(s): CHOL, HDL, LDLCALC, TRIG, CHOLHDL, LDLDIRECT in the last 72 hours. Thyroid function studies No results for input(s): TSH,  T4TOTAL, T3FREE, THYROIDAB in the last 72 hours.  Invalid input(s): FREET3 Anemia work up No results for input(s): VITAMINB12, FOLATE, FERRITIN, TIBC, IRON, RETICCTPCT in the last 72 hours. Urinalysis    Component Value Date/Time   COLORURINE STRAW (A) 11/13/2017 0601   APPEARANCEUR CLEAR 11/13/2017 0601   LABSPEC 1.006 11/13/2017 0601   PHURINE 9.0 (H) 11/13/2017 0601   GLUCOSEU >=500 (A) 11/13/2017 0601   HGBUR SMALL (A) 11/13/2017 0601   BILIRUBINUR NEGATIVE 11/13/2017 0601   KETONESUR 20 (A) 11/13/2017 0601   PROTEINUR 30 (A) 11/13/2017 0601   UROBILINOGEN 0.2 09/10/2010 0951   NITRITE NEGATIVE 11/13/2017 0601   LEUKOCYTESUR NEGATIVE 11/13/2017 0601   Sepsis Labs Invalid input(s): PROCALCITONIN,  WBC,  LACTICIDVEN Microbiology No results found for this or any previous visit (from the past 240 hour(s)).   Time coordinating discharge: 32 minutes  SIGNED:  Latrelle Dodrill, MD  [facility] 11/15/2017, 12:40 PM  Pager please text page via  [email or web address]  Note - This record has been created using [employer]. Chart creation errors have been sought, but may not always have been located. Such creation errors do not reflect on the standard of medical care.

## 2018-04-01 ENCOUNTER — Emergency Department (HOSPITAL_COMMUNITY): Payer: Self-pay

## 2018-04-01 ENCOUNTER — Other Ambulatory Visit: Payer: Self-pay

## 2018-04-01 ENCOUNTER — Encounter (HOSPITAL_COMMUNITY): Payer: Self-pay

## 2018-04-01 ENCOUNTER — Inpatient Hospital Stay (HOSPITAL_COMMUNITY)
Admission: EM | Admit: 2018-04-01 | Discharge: 2018-04-03 | DRG: 071 | Disposition: A | Discharge status: Home / Self Care | Payer: Self-pay | Attending: Internal Medicine | Admitting: Internal Medicine

## 2018-04-01 DIAGNOSIS — E860 Dehydration: Secondary | ICD-10-CM

## 2018-04-01 DIAGNOSIS — Z7951 Long term (current) use of inhaled steroids: Secondary | ICD-10-CM

## 2018-04-01 DIAGNOSIS — E876 Hypokalemia: Secondary | ICD-10-CM

## 2018-04-01 DIAGNOSIS — D72825 Bandemia: Secondary | ICD-10-CM

## 2018-04-01 DIAGNOSIS — R9431 Abnormal electrocardiogram [ECG] [EKG]: Secondary | ICD-10-CM

## 2018-04-01 DIAGNOSIS — K529 Noninfective gastroenteritis and colitis, unspecified: Secondary | ICD-10-CM | POA: Diagnosis present

## 2018-04-01 DIAGNOSIS — R1115 Cyclical vomiting syndrome unrelated to migraine: Secondary | ICD-10-CM | POA: Diagnosis present

## 2018-04-01 DIAGNOSIS — G9341 Metabolic encephalopathy: Principal | ICD-10-CM

## 2018-04-01 DIAGNOSIS — R112 Nausea with vomiting, unspecified: Secondary | ICD-10-CM

## 2018-04-01 DIAGNOSIS — G40909 Epilepsy, unspecified, not intractable, without status epilepticus: Secondary | ICD-10-CM

## 2018-04-01 DIAGNOSIS — Z79899 Other long term (current) drug therapy: Secondary | ICD-10-CM

## 2018-04-01 DIAGNOSIS — I110 Hypertensive heart disease with heart failure: Secondary | ICD-10-CM | POA: Diagnosis present

## 2018-04-01 DIAGNOSIS — I10 Essential (primary) hypertension: Secondary | ICD-10-CM

## 2018-04-01 DIAGNOSIS — I5032 Chronic diastolic (congestive) heart failure: Secondary | ICD-10-CM

## 2018-04-01 DIAGNOSIS — D72829 Elevated white blood cell count, unspecified: Secondary | ICD-10-CM | POA: Diagnosis present

## 2018-04-01 DIAGNOSIS — G9340 Encephalopathy, unspecified: Secondary | ICD-10-CM

## 2018-04-01 DIAGNOSIS — R6510 Systemic inflammatory response syndrome (SIRS) of non-infectious origin without acute organ dysfunction: Secondary | ICD-10-CM

## 2018-04-01 DIAGNOSIS — F1210 Cannabis abuse, uncomplicated: Secondary | ICD-10-CM

## 2018-04-01 DIAGNOSIS — R4182 Altered mental status, unspecified: Secondary | ICD-10-CM

## 2018-04-01 DIAGNOSIS — F329 Major depressive disorder, single episode, unspecified: Secondary | ICD-10-CM | POA: Diagnosis present

## 2018-04-01 DIAGNOSIS — E871 Hypo-osmolality and hyponatremia: Secondary | ICD-10-CM

## 2018-04-01 DIAGNOSIS — Z87891 Personal history of nicotine dependence: Secondary | ICD-10-CM

## 2018-04-01 HISTORY — DX: Heart failure, unspecified: I50.9

## 2018-04-01 LAB — URINALYSIS, ROUTINE W REFLEX MICROSCOPIC
Bacteria, UA: NONE SEEN
Bilirubin Urine: NEGATIVE
Glucose, UA: NEGATIVE mg/dL
Ketones, ur: 5 mg/dL — AB
Nitrite: NEGATIVE
Protein, ur: NEGATIVE mg/dL
SPECIFIC GRAVITY, URINE: 1.006 (ref 1.005–1.030)
pH: 7 (ref 5.0–8.0)

## 2018-04-01 LAB — CBC
HCT: 45.7 % (ref 36.0–46.0)
HEMOGLOBIN: 15.5 g/dL — AB (ref 12.0–15.0)
MCH: 29.5 pg (ref 26.0–34.0)
MCHC: 33.9 g/dL (ref 30.0–36.0)
MCV: 86.9 fL (ref 80.0–100.0)
Platelets: 267 10*3/uL (ref 150–400)
RBC: 5.26 MIL/uL — ABNORMAL HIGH (ref 3.87–5.11)
RDW: 12.8 % (ref 11.5–15.5)
WBC: 14.3 10*3/uL — AB (ref 4.0–10.5)
nRBC: 0 % (ref 0.0–0.2)

## 2018-04-01 LAB — COMPREHENSIVE METABOLIC PANEL
ALBUMIN: 3.9 g/dL (ref 3.5–5.0)
ALK PHOS: 57 U/L (ref 38–126)
ALT: 21 U/L (ref 0–44)
AST: 27 U/L (ref 15–41)
Anion gap: 17 — ABNORMAL HIGH (ref 5–15)
BILIRUBIN TOTAL: 1 mg/dL (ref 0.3–1.2)
BUN: 22 mg/dL (ref 8–23)
CALCIUM: 10.2 mg/dL (ref 8.9–10.3)
CO2: 22 mmol/L (ref 22–32)
CREATININE: 1.03 mg/dL — AB (ref 0.44–1.00)
Chloride: 85 mmol/L — ABNORMAL LOW (ref 98–111)
GFR calc Af Amer: >60 mL/min (ref >60)
GFR, EST NON AFRICAN AMERICAN: 57 mL/min — AB (ref >60)
GLUCOSE: 90 mg/dL (ref 70–99)
Potassium: 3.1 mmol/L — ABNORMAL LOW (ref 3.5–5.1)
Sodium: 124 mmol/L — ABNORMAL LOW (ref 135–145)
TOTAL PROTEIN: 6.6 g/dL (ref 6.5–8.1)

## 2018-04-01 LAB — RAPID URINE DRUG SCREEN, HOSP PERFORMED
AMPHETAMINES: NOT DETECTED
Barbiturates: NOT DETECTED
Benzodiazepines: NOT DETECTED
Cocaine: NOT DETECTED
OPIATES: NOT DETECTED
TETRAHYDROCANNABINOL: POSITIVE — AB

## 2018-04-01 LAB — CBG MONITORING, ED: GLUCOSE-CAPILLARY: 80 mg/dL (ref 70–99)

## 2018-04-01 LAB — I-STAT BETA HCG BLOOD, ED (MC, WL, AP ONLY): I-stat hCG, quantitative: <5 m[IU]/mL (ref <5)

## 2018-04-01 LAB — AMMONIA: Ammonia: 21 umol/L (ref 9–35)

## 2018-04-01 LAB — OSMOLALITY, URINE: Osmolality, Ur: 207 mOsm/kg — ABNORMAL LOW (ref 300–900)

## 2018-04-01 LAB — ETHANOL: Alcohol, Ethyl (B): <10 mg/dL (ref <10)

## 2018-04-01 LAB — VALPROIC ACID LEVEL: Valproic Acid Lvl: <10 ug/mL — ABNORMAL LOW (ref 50.0–100.0)

## 2018-04-01 LAB — LACTIC ACID, PLASMA: Lactic Acid, Venous: 2.2 mmol/L (ref 0.5–1.9)

## 2018-04-01 LAB — MAGNESIUM: Magnesium: 1.9 mg/dL (ref 1.7–2.4)

## 2018-04-01 LAB — CREATININE, URINE, RANDOM: Creatinine, Urine: 37.99 mg/dL

## 2018-04-01 LAB — SODIUM, URINE, RANDOM: Sodium, Ur: 14 mmol/L

## 2018-04-01 MED ORDER — ALBUTEROL SULFATE (2.5 MG/3ML) 0.083% IN NEBU: 2.5000 mg | INHALATION_SOLUTION | RESPIRATORY_TRACT | Status: DC | PRN

## 2018-04-01 MED ORDER — LORAZEPAM 2 MG/ML IJ SOLN: 2.0000 mg | INTRAMUSCULAR | Status: DC | PRN

## 2018-04-01 MED ORDER — LACTATED RINGERS IV BOLUS
1000.0000 mL | Freq: Once | INTRAVENOUS | Status: AC
  Administered 2018-04-01: 1000 mL via INTRAVENOUS

## 2018-04-01 MED ORDER — SODIUM CHLORIDE 0.9 % IV SOLN
2.0000 g | Freq: Once | INTRAVENOUS | Status: AC
  Administered 2018-04-01: 2 g via INTRAVENOUS
  Filled 2018-04-01: qty 20

## 2018-04-01 MED ORDER — SODIUM CHLORIDE 0.9 % IV SOLN: 2.0000 g | INTRAVENOUS | Status: DC

## 2018-04-01 MED ORDER — THIAMINE HCL 100 MG/ML IJ SOLN
500.0000 mg | Freq: Three times a day (TID) | INTRAVENOUS | Status: DC
  Administered 2018-04-02 – 2018-04-03 (×5): 500 mg via INTRAVENOUS
  Filled 2018-04-01 (×6): qty 5

## 2018-04-01 MED ORDER — LACTATED RINGERS IV BOLUS: 1000.0000 mL | Freq: Once | INTRAVENOUS | Status: AC

## 2018-04-01 MED ORDER — LEVETIRACETAM IN NACL 1000 MG/100ML IV SOLN
1000.0000 mg | Freq: Once | INTRAVENOUS | Status: DC
  Filled 2018-04-01: qty 100

## 2018-04-01 MED ORDER — LEVETIRACETAM IN NACL 500 MG/100ML IV SOLN: 500.0000 mg | Freq: Two times a day (BID) | INTRAVENOUS | Status: DC

## 2018-04-01 MED ORDER — METRONIDAZOLE IN NACL 5-0.79 MG/ML-% IV SOLN
500.0000 mg | Freq: Three times a day (TID) | INTRAVENOUS | Status: DC
  Administered 2018-04-02 (×2): 500 mg via INTRAVENOUS
  Filled 2018-04-01 (×2): qty 100

## 2018-04-01 MED ORDER — FOLIC ACID 5 MG/ML IJ SOLN
1.0000 mg | Freq: Every day | INTRAMUSCULAR | Status: DC
  Administered 2018-04-02 – 2018-04-03 (×2): 1 mg via INTRAVENOUS
  Filled 2018-04-01 (×2): qty 0.2

## 2018-04-01 MED ORDER — LORAZEPAM 2 MG/ML IJ SOLN
2.0000 mg | Freq: Once | INTRAMUSCULAR | Status: AC
  Administered 2018-04-01: 2 mg via INTRAVENOUS
  Filled 2018-04-01: qty 1

## 2018-04-01 MED ORDER — SODIUM CHLORIDE 0.9 % IV SOLN
INTRAVENOUS | Status: AC
  Administered 2018-04-01: via INTRAVENOUS

## 2018-04-01 MED ORDER — MOMETASONE FURO-FORMOTEROL FUM 100-5 MCG/ACT IN AERO
2.0000 | INHALATION_SPRAY | Freq: Two times a day (BID) | RESPIRATORY_TRACT | Status: DC
  Administered 2018-04-02 – 2018-04-03 (×2): 2 via RESPIRATORY_TRACT
  Filled 2018-04-01: qty 8.8

## 2018-04-01 MED ORDER — POTASSIUM CHLORIDE 10 MEQ/100ML IV SOLN
10.0000 meq | INTRAVENOUS | Status: AC
  Administered 2018-04-02 (×4): 10 meq via INTRAVENOUS
  Filled 2018-04-01 (×5): qty 100

## 2018-04-01 MED ORDER — SODIUM CHLORIDE 0.9 % IV BOLUS (SEPSIS)
1000.0000 mL | Freq: Once | INTRAVENOUS | Status: AC
  Administered 2018-04-01: 1000 mL via INTRAVENOUS

## 2018-04-01 MED ORDER — THIAMINE HCL 100 MG/ML IJ SOLN: 100.0000 mg | Freq: Every day | INTRAMUSCULAR | Status: DC

## 2018-04-01 MED ORDER — ACETAMINOPHEN 650 MG RE SUPP: 650.0000 mg | Freq: Four times a day (QID) | RECTAL | Status: DC | PRN

## 2018-04-01 MED ORDER — SODIUM CHLORIDE 0.9% FLUSH
3.0000 mL | Freq: Once | INTRAVENOUS | Status: AC
  Administered 2018-04-01: 3 mL via INTRAVENOUS

## 2018-04-01 MED ORDER — ACETAMINOPHEN 325 MG PO TABS: 650.0000 mg | ORAL_TABLET | Freq: Four times a day (QID) | ORAL | Status: DC | PRN

## 2018-04-01 MED ORDER — LEVETIRACETAM IN NACL 500 MG/100ML IV SOLN
500.0000 mg | Freq: Two times a day (BID) | INTRAVENOUS | Status: DC
  Administered 2018-04-02 – 2018-04-03 (×3): 500 mg via INTRAVENOUS
  Filled 2018-04-01 (×3): qty 100

## 2018-04-01 MED ORDER — ADULT MULTIVITAMIN W/MINERALS CH
1.0000 | ORAL_TABLET | Freq: Every day | ORAL | Status: DC
  Filled 2018-04-01: qty 1

## 2018-04-01 NOTE — ED Provider Notes (Signed)
[facility] EMERGENCY DEPARTMENT Provider Note   CSN: [id number] Arrival date & time: 04/01/18  1805    History   Chief Complaint Chief Complaint  Patient presents with  . Altered Mental Status    HPI Courtney Miles is a 63 y.o. female.     Patient is a 63 year old female with past medical history of questionable seizure disorder, alcohol abuse, altered mental status, cyclical vomiting, hypertension, hyponatremia who presents to the emergency department via EMS with her husband for altered mental status.  Patient has had similar reactions in the past for which she has been admitted x3. Her symptoms have been attributed to ETOH withdrawal and metabolic acidosis with hypotension in the context of gastritis vs colitis.  She was referred to gastroenterology but has been states that they have not had the money to follow-up with a specialist.  Husband states that over the last week and a half she has been having her usual symptoms of gastroenteritis to include nausea, vomiting, decreased oral intake and fever.  He reports that all of this had actually been resolving and she was tolerating p.o. and was afebrile today.  He reports however, around 3 PM she was laying on the couch and began to act altered.  He reports that she was hallucinating and did not know where she was.  Husband states that she did not have a seizure, although she has not been taking her seizure medication. reports that she is having a lot of trouble concentrating and following commands.  Per EMS she was combative and trying to take off her seatbelt.  The husband is adamant that she has not had any drugs or marijuana in at least 5 days.  Also states that she has not drank any alcohol in at least 5 days.  Reports that prior to that she drink 1 or 2 drinks a night.  Currently the patient is alert but is disoriented.  The husband states that she was not complaining about any abdominal pain, chest pain, shortness of  breath today.     Past Medical History:  Diagnosis Date  . Arthritis   . Depression   . Gastroenteritis   . Hypertension     Patient Active Problem List   Diagnosis Date Noted  . Acute encephalopathy 04/01/2018  . Tetrahydrocannabinol (THC) use disorder, mild, abuse 04/01/2018  . Acute metabolic encephalopathy 09/13/2017  . Colitis 09/13/2017  . Overweight (BMI 25.0-29.9) 09/13/2017  . Depression 09/13/2017  . Chronic diastolic CHF (congestive heart failure) (HCC) 09/13/2017  . Nausea & vomiting 04/30/2017  . Dehydration 04/30/2017  . Leukocytosis 04/30/2017  . Hypokalemia 04/30/2017  . Prolonged QT interval 04/30/2017  . Post-ictal state (HCC) 04/30/2017  . Gastroenteritis   . Altered mental status 04/02/2012  . Seizure disorder (HCC) 03/31/2012  . Abnormal EKG 03/31/2012  . 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 HTN (hypertension) 03/31/2012  . Hyponatremia 03/31/2012  . Viral illness 03/31/2012    No past surgical history on file.   OB History   No obstetric history on file.      Home Medications    Prior to Admission medications   Medication Sig Start Date End Date Taking? Authorizing Provider  acetaminophen (TYLENOL) 500 MG tablet Take 1,000 mg by mouth every 6 (six) hours as needed for headache (pain).   Yes [provider]  albuterol (PROVENTIL HFA;VENTOLIN HFA) 108 (90 Base) MCG/ACT inhaler Inhale 2 puffs into the lungs every 6 (six) hours as needed for wheezing or  shortness of breath.   Yes [provider]  Cyanocobalamin (VITAMIN B-12 PO) Take 1 tablet by mouth daily.   Yes [provider]  escitalopram (LEXAPRO) 10 MG tablet Take 10 mg by mouth daily after breakfast. 02/27/17  Yes [provider]  loratadine (CLARITIN) 10 MG tablet Take 10 mg by mouth daily.   Yes [provider]  metoprolol succinate (TOPROL-XL) 50 MG 24 hr tablet Take 50 mg by mouth daily. Take with or immediately following a meal.   Yes [provider]  mometasone-formoterol (DULERA) 100-5 MCG/ACT AERO Inhale 2 puffs into the lungs 2 (two) times daily.   Yes [provider]  promethazine (PHENERGAN) 25 MG suppository Place 25 mg rectally every 6 (six) hours as needed for nausea or vomiting.  09/11/17  Yes [provider]  promethazine (PHENERGAN) 25 MG tablet Take 25 mg by mouth every 6 (six) hours as needed for nausea or vomiting.  03/28/18  Yes [provider]  losartan (COZAAR) 25 MG tablet Take 1 tablet (25 mg total) by mouth daily. Patient not taking: Reported on 04/01/2018 11/15/17   Randel Pigg, Dorma Russell, MD  pantoprazole (PROTONIX) 40 MG tablet Take 1 tablet (40 mg total) by mouth daily. Patient not taking: Reported on 04/01/2018 11/15/17   Lenox Ponds, MD    Family History Family History  Problem Relation Age of Onset  . Angina Mother   . Heart attack Mother   . Diabetic kidney disease Sister   . Seizures Sister     Social History Social History   Tobacco Use  . Smoking status: Former Games developer  . Smokeless tobacco: Never Used  Substance Use Topics  . Alcohol use: Yes    Alcohol/week: 1.0 standard drinks    Types: 1 Standard drinks or equivalent per week  . Drug use: Yes    Frequency: 7.0 times per week    Types: Marijuana     Allergies   Sulfa antibiotics   Review of Systems Review of Systems  Unable to perform ROS: Mental status change     Physical Exam Updated Vital Signs BP 100/69 (BP Location: Left Arm)   Pulse 84   Temp 98.1 F (36.7 C) (Oral)   Resp 16   SpO2 99%   Physical Exam Vitals signs and nursing note reviewed.  Constitutional:      General: She is not in acute distress.    Appearance: She is not ill-appearing, toxic-appearing or diaphoretic.  HENT:     Head: Normocephalic and atraumatic.     Nose: Nose normal.     Mouth/Throat:     Mouth: Mucous membranes are moist.     Pharynx: Oropharynx is clear.  Eyes:     Conjunctiva/sclera:  Conjunctivae normal.     Pupils: Pupils are equal, round, and reactive to light.  Cardiovascular:     Rate and Rhythm: Normal rate and regular rhythm.     Heart sounds: Murmur present.  Pulmonary:     Effort: Pulmonary effort is normal.     Breath sounds: Normal breath sounds. No wheezing, rhonchi or rales.  Abdominal:     General: Abdomen is flat. There is no distension.     Palpations: Abdomen is soft.     Tenderness: There is no abdominal tenderness. There is no guarding.  Musculoskeletal:     Right lower leg: No edema.     Left lower leg: No edema.  Skin:    General: Skin is warm.  Capillary Refill: Capillary refill takes less than 2 seconds.     Findings: No bruising.  Neurological:     Mental Status: She is alert.     Comments: Patient is alert and acknowledges questions but seems to have innappropiate answers and/or difficulty concentrating. She will occasionally follow commands but mostly will not. She is not combative, she is mildly anxious  Psychiatric:        Mood and Affect: Mood is anxious.        Cognition and Memory: Cognition is impaired.      ED Treatments / Results  Labs (all labs ordered are listed, but only abnormal results are displayed) Labs Reviewed  COMPREHENSIVE METABOLIC PANEL - Abnormal; Notable for the following components:      Result Value   Sodium 124 (*)    Potassium 3.1 (*)    Chloride 85 (*)    Creatinine, Ser 1.03 (*)    GFR calc non Af Amer 57 (*)    Anion gap 17 (*)    All other components within normal limits  CBC - Abnormal; Notable for the following components:   WBC 14.3 (*)    RBC 5.26 (*)    Hemoglobin 15.5 (*)    All other components within normal limits  VALPROIC ACID LEVEL - Abnormal; Notable for the following components:   Valproic Acid Lvl <10 (*)    All other components within normal limits  URINALYSIS, ROUTINE W REFLEX MICROSCOPIC - Abnormal; Notable for the following components:   Color, Urine STRAW (*)    Hgb  urine dipstick SMALL (*)    Ketones, ur 5 (*)    Leukocytes,Ua MODERATE (*)    All other components within normal limits  RAPID URINE DRUG SCREEN, HOSP PERFORMED - Abnormal; Notable for the following components:   Tetrahydrocannabinol POSITIVE (*)    All other components within normal limits  LACTIC ACID, PLASMA - Abnormal; Notable for the following components:   Lactic Acid, Venous 2.2 (*)    All other components within normal limits  OSMOLALITY, URINE - Abnormal; Notable for the following components:   Osmolality, Ur 207 (*)    All other components within normal limits  CULTURE, BLOOD (ROUTINE X 2)  CULTURE, BLOOD (ROUTINE X 2)  URINE CULTURE  GASTROINTESTINAL PANEL BY PCR, STOOL (REPLACES STOOL CULTURE)  AMMONIA  ETHANOL  MAGNESIUM  SODIUM, URINE, RANDOM  CREATININE, URINE, RANDOM  LACTIC ACID, PLASMA  BLOOD GAS, VENOUS  OSMOLALITY  PHOSPHORUS  TROPONIN I  TROPONIN I  TROPONIN I  BASIC METABOLIC PANEL  BASIC METABOLIC PANEL  LACTIC ACID, PLASMA  LACTIC ACID, PLASMA  PROCALCITONIN  PROTIME-INR  APTT  MAGNESIUM  PHOSPHORUS  TSH  COMPREHENSIVE METABOLIC PANEL  CBC  VITAMIN B1  CBG MONITORING, ED  I-STAT BETA HCG BLOOD, ED ([facility], [facility], [facility] ONLY)    EKG EKG Interpretation  Date/Time:  Saturday April 01 2018 18:13:25 EST Ventricular Rate:  95 PR Interval:    QRS Duration: 94 QT Interval:  442 QTC Calculation: 556 R Axis:   -70 Text Interpretation:  Sinus rhythm Left anterior fascicular block Abnormal R-wave progression, early transition LVH with secondary repolarization abnormality ST depr, consider ischemia, inferior leads Prolonged QT interval rate is faster compared to Oct 2019 Confirmed by Pricilla Loveless [phone]) on 04/01/2018 6:45:33 PM   Radiology Dg Chest 1 View  Result Date: 04/01/2018 CLINICAL DATA:  Altered mental status EXAM: CHEST  1 VIEW COMPARISON:  09/13/2017 FINDINGS: Heart is borderline in size.  No confluent airspace opacities, effusions or  edema. No acute bony abnormality. IMPRESSION: No active disease. Electronically Signed   By: Charlett Nose M.D.   On: 04/01/2018 20:15   Ct Head Wo Contrast  Result Date: 04/01/2018 CLINICAL DATA:  Altered mental status EXAM: CT HEAD WITHOUT CONTRAST TECHNIQUE: Contiguous axial images were obtained from the base of the skull through the vertex without intravenous contrast. COMPARISON:  09/13/2017 FINDINGS: Brain: No acute intracranial abnormality. Specifically, no hemorrhage, hydrocephalus, mass lesion, acute infarction, or significant intracranial injury. Vascular: No hyperdense vessel or unexpected calcification. Skull: No acute calvarial abnormality. Sinuses/Orbits: No acute finding Other: None IMPRESSION: No intracranial abnormality. Electronically Signed   By: Charlett Nose M.D.   On: 04/01/2018 19:18    Procedures Procedures (including critical care time)  Medications Ordered in ED Medications  lactated ringers bolus 1,000 mL (has no administration in time range)  potassium chloride 10 mEq in 100 mL IVPB (has no administration in time range)  LORazepam (ATIVAN) injection 2-3 mg (has no administration in time range)  multivitamin with minerals tablet 1 tablet (has no administration in time range)  folic acid injection 1 mg (has no administration in time range)  cefTRIAXone (ROCEPHIN) 2 g in sodium chloride 0.9 % 100 mL IVPB (has no administration in time range)  metroNIDAZOLE (FLAGYL) IVPB 500 mg (has no administration in time range)  mometasone-formoterol (DULERA) 100-5 MCG/ACT inhaler 2 puff ( Inhalation Canceled Entry 04/01/18 2319)  acetaminophen (TYLENOL) tablet 650 mg (has no administration in time range)    Or  acetaminophen (TYLENOL) suppository 650 mg (has no administration in time range)  0.9 %  sodium chloride infusion (has no administration in time range)  albuterol (PROVENTIL) (2.5 MG/3ML) 0.083% nebulizer solution 2.5 mg (has no administration in time range)  levETIRAcetam  (KEPPRA) IVPB 500 mg/100 mL premix (has no administration in time range)  levETIRAcetam (KEPPRA) IVPB 1000 mg/100 mL premix (has no administration in time range)  thiamine  in normal saline (50ml) IVPB (has no administration in time range)  sodium chloride flush (NS) 0.9 % injection 3 mL (3 mLs Intravenous Given 04/01/18 2002)  sodium chloride 0.9 % bolus 1,000 mL (0 mLs Intravenous Stopped 04/01/18 1959)  LORazepam (ATIVAN) injection 2 mg (2 mg Intravenous Given 04/01/18 2004)  lactated ringers bolus 1,000 mL (0 mLs Intravenous Stopped 04/01/18 2157)  cefTRIAXone (ROCEPHIN) 2 g in sodium chloride 0.9 % 100 mL IVPB (2 g Intravenous New Bag/Given 04/01/18 2207)     Initial Impression / Assessment and Plan / ED Course  I have reviewed the triage vital signs and the nursing notes.  Pertinent labs & imaging results that were available during my care of the patient were reviewed by me and considered in my medical decision making (see chart for details).  Clinical Course as of Apr 01 2329  Sat Apr 01, 2018  1610 Patient is here with AMS. Hx of similar in the past and related to ?seizure disorder, ?ETHOS abuse/ withdrawal and chronic cyclical vomiting and abdominal pain. Patient originally hypotensive 80s/60s. She became combative but this resolved with ativan. She is getting LR bolus and BP is responding at this time.95/72. Patient has hx of the same in the past and appears to have metabolic encephalopathy related to hypotension proceeded by n/v and poor oral intake. No seizure activity noted by husband. There is a ? Of alcohol w/d as well. CT head, chest xray negative. She has lactic acid of 2.2 with elevated WBC count and +  wbc and leuk in urine so I will treat for UTI. She has hypokalemia and hyponatremia as well. Magnesium normal and ethanol level <10. Will consult for admission at this time   [KM]    Clinical Course User Index [KM] Arlyn Dunning, PA-C         Clinical Impression: 1.  Altered mental status, unspecified altered mental status type   2. Metabolic encephalopathy   3. Hyponatremia   4. Hypokalemia     Disposition: Discharge  CRITICAL CARE Performed by: Arlyn Dunning   Total critical care time: 40 minutes  Critical care time was exclusive of separately billable procedures and treating other patients.  Critical care was necessary to treat or prevent imminent or life-threatening deterioration.  Critical care was time spent personally by me on the following activities: development of treatment plan with patient and/or surrogate as well as nursing, discussions with consultants, evaluation of patient's response to treatment, examination of patient, obtaining history from patient or surrogate, ordering and performing treatments and interventions, ordering and review of laboratory studies, ordering and review of radiographic studies, pulse oximetry and re-evaluation of patient's condition.   This note was prepared with assistance of Conservation officer, historic buildings. Occasional wrong-word or sound-a-like substitutions may have occurred due to the inherent limitations of voice recognition software.   Final Clinical Impressions(s) / ED Diagnoses   Final diagnoses:  Altered mental status, unspecified altered mental status type  Metabolic encephalopathy  Hyponatremia  Hypokalemia    ED Discharge Orders    None       Jeral Pinch 04/01/18 2332    Pricilla Loveless, MD 04/01/18 [phone]

## 2018-04-01 NOTE — ED Notes (Signed)
Family reports recent hx of N/V/D.

## 2018-04-01 NOTE — H&P (Signed)
Courtney Miles WER:[id number] DOB: [birth year] DOA: 04/01/2018     PCP: Cari Caraway, MD   Outpatient Specialists:  none    Patient arrived to ER on 04/01/18 at [place]  Patient coming from: home Lives  With family    Chief Complaint:  Chief Complaint  Patient presents with  . Altered Mental Status    HPI: Courtney Miles is a 63 y.o. female with medical history significant of   chronic diastolic CHF, hypertension, seizure disorder, depression, recurrent nausea and vomiting    Presented with alteration of mental status that started around 3 PM today.  She was noted to be combative. Patient has longstanding history of marijuana abuse.  Per EMS report house smelled of marijuana strongly although husband denies patient using any.  On EMS arrival patient combative blood pressure 100/60 heart rates 82 satting 100% on room air. Per husband she has been having nausea vomiting diarrhea and occasional fevers for few days and finally started to improve she was starting to drink fluids today when became altered and confused.  It is not sure where she was.  No seizure activity.  She has not been taking her seizure medications.  He alcohol for past 5 days. Has been states she has not been reporting any abdominal pain chest pain shortness of breath fevers have stopped.  Patient has alcohol use in the past was heavy but now husband states she only drinks 1-2 drinks a day.  Last admission for the same was in October 2019 when she presented with vomiting diarrhea noted at that time to also have leukocytosis and hypokalemia was thought to be altered at that time.  She have had a total of 4 similar episodes this year.  Admitted for gastroenteritis that time CT abdomen unremarkable.  Was given IV fluids antiemetics and pain control and improved she was supposed to follow-up with GI for possible EGD but did not.  In March she had similar admission during that time she did had a RI of her brain which was  unremarkable   Recently her daughter and Grad-children have had a fever nausea/vomiting diarrhea but now recovering. Patient developed some URI symptoms within 2 days  As er Husband she has frequent episodes where she develops nausea which is difficult to control she becomes dehydrated and sometimes have episodes of seizure-like activity when she gets too dehydrated she has not taken any medications for seizures.  In March she had an EEG done that was consistent with a focal area of cerebral dysfunction in the left occipital region.  Dr. Leonel Ramsay had recommended her started on   Keppra 500 mg twice daily for some reason she is not on it.   Regarding pertinent Chronic problems:  Hypertension on losartan metoprolol While in ER: Sodium noted to be 124 potassium 3.1 She had some leukocytes in her urine and was started on Rocephin  U tox positive for THC Initially hypotensive in 80s over 60s after some IV fluids now blood pressure up to 95/72 patient is still lobe at home alert but continues to be confused She was agitated and required Ativan in emergency department  The following Work up has been ordered so far:  Orders Placed This Encounter  Procedures  . Blood culture (routine x 2)  . CT Head Wo Contrast  . DG Chest 1 View  . Comprehensive metabolic panel  . CBC  . Ammonia  . Ethanol  . Valproic acid level  . Magnesium  . Urinalysis,  Routine w reflex microscopic  . Rapid urine drug screen (hospital performed)  . Lactic acid, plasma  . Blood gas, venous (at [facility] and [facility])  . Diet NPO time specified  . Cardiac monitoring  . Saline Lock IV, Maintain IV access  . Neuro checks q 2 hours x12 hours  . [facility] institute withdrawal assessment  . Consult to hospitalist  [phone]  . Pulse oximetry, continuous  . CBG monitoring, ED  . POC beta hCG blood, ED  . EKG 12-Lead  . Insert peripheral IV     Following Medications were ordered in ER: Medications  lactated ringers bolus  1,000 mL (has no administration in time range)  cefTRIAXone (ROCEPHIN) 2 g in sodium chloride 0.9 % 100 mL IVPB (has no administration in time range)  sodium chloride flush (NS) 0.9 % injection 3 mL (3 mLs Intravenous Given 04/01/18 2002)  sodium chloride 0.9 % bolus 1,000 mL (0 mLs Intravenous Stopped 04/01/18 1959)  LORazepam (ATIVAN) injection 2 mg (2 mg Intravenous Given 04/01/18 2004)  lactated ringers bolus 1,000 mL (1,000 mLs Intravenous New Bag/Given 04/01/18 2012)    Significant initial  Findings: Abnormal Labs Reviewed  COMPREHENSIVE METABOLIC PANEL - Abnormal; Notable for the following components:      Result Value   Sodium 124 (*)    Potassium 3.1 (*)    Chloride 85 (*)    Creatinine, Ser 1.03 (*)    GFR calc non Af Amer 57 (*)    Anion gap 17 (*)    All other components within normal limits  CBC - Abnormal; Notable for the following components:   WBC 14.3 (*)    RBC 5.26 (*)    Hemoglobin 15.5 (*)    All other components within normal limits  VALPROIC ACID LEVEL - Abnormal; Notable for the following components:   Valproic Acid Lvl <10 (*)    All other components within normal limits  URINALYSIS, ROUTINE W REFLEX MICROSCOPIC - Abnormal; Notable for the following components:   Color, Urine STRAW (*)    Hgb urine dipstick SMALL (*)    Ketones, ur 5 (*)    Leukocytes,Ua MODERATE (*)    All other components within normal limits  RAPID URINE DRUG SCREEN, HOSP PERFORMED - Abnormal; Notable for the following components:   Tetrahydrocannabinol POSITIVE (*)    All other components within normal limits  LACTIC ACID, PLASMA - Abnormal; Notable for the following components:   Lactic Acid, Venous 2.2 (*)    All other components within normal limits     Lactic Acid, Venous    Component Value Date/Time   LATICACIDVEN 2.2 (HH) 04/01/2018 1957     Cr  Up from baseline see below Lab Results  Component Value Date   CREATININE 1.03 (H) 04/01/2018   CREATININE 0.84 11/14/2017    CREATININE 0.50 11/13/2017       HG/HCT stable,       Component Value Date/Time   HGB 15.5 (H) 04/01/2018 1818   HCT 45.7 04/01/2018 1818    Troponin <0.03     UA leukocytes   CT HEAD NON acute  CXR -  NON acute    ECG:  Personally reviewed by me showing: HR :  95 Rhythm:  NSR  Ischemic changes nonspecific changes,   QTC 556    ED Triage Vitals  Enc Vitals Group     BP 04/01/18 1814 (!) 88/54     Pulse Rate 04/01/18 1814 93     Resp  04/01/18 1814 16     Temp 04/01/18 1814 98.5 F (36.9 C)     Temp src --      SpO2 04/01/18 1809 100 %     Weight --      Height --      Head Circumference --      Peak Flow --      Pain Score --      Pain Loc --      Pain Edu? --      Excl. in [address]? --   TMAX(24)@       Latest  Blood pressure 95/72, pulse 83, temperature 98.5 F (36.9 C), resp. rate 15, SpO2 99 %.     Hospitalist was called for admission for acute encephalopathy in the setting of hyponatremia   Review of Systems:    Pertinent positives include: Fevers, chills, fatigue,abdominal pain, nausea, vomiting, diarrhea, confusion hallucinations  Constitutional:  No weight loss, night sweats,  weight loss  HEENT:  No headaches, Difficulty swallowing,Tooth/dental problems,Sore throat,  No sneezing, itching, ear ache, nasal congestion, post nasal drip,  Cardio-vascular:  No chest pain, Orthopnea, PND, anasarca, dizziness, palpitations.no Bilateral lower extremity swelling  GI:  No heartburn, indigestion,  change in bowel habits, loss of appetite, melena, blood in stool, hematemesis Resp:  no shortness of breath at rest. No dyspnea on exertion, No excess mucus, no productive cough, No non-productive cough, No coughing up of blood.No change in color of mucus.No wheezing. Skin:  no rash or lesions. No jaundice GU:  no dysuria, change in color of urine, no urgency or frequency. No straining to urinate.  No flank pain.  Musculoskeletal:  No joint pain or no joint  swelling. No decreased range of motion. No back pain.  Psych:  No change in mood or affect. No depression or anxiety. No memory loss.  Neuro: no localizing neurological complaints, no tingling, no weakness, no double vision, no gait abnormality, no slurred speech,   All systems reviewed and apart from [place] all are negative  Past Medical History:   Past Medical History:  Diagnosis Date  . Arthritis   . Depression   . Gastroenteritis   . Hypertension       No past surgical history on file.  Social History:  Ambulatory  independently      reports that she has quit smoking. She has never used smokeless tobacco. She reports current alcohol use of about 1.0 standard drinks of alcohol per week. She reports current drug use. Frequency: 7.00 times per week. Drug: Marijuana.     Family History:   Family History  Problem Relation Age of Onset  . Angina Mother   . Heart attack Mother   . Diabetic kidney disease Sister   . Seizures Sister     Allergies: Allergies  Allergen Reactions  . Sulfa Antibiotics Other (See Comments)    Turned bright red      Prior to Admission medications   Medication Sig Start Date End Date Taking? Authorizing Provider  acetaminophen (TYLENOL) 500 MG tablet Take 1,000 mg by mouth every 6 (six) hours as needed for headache (pain).   Yes [provider]  albuterol (PROVENTIL HFA;VENTOLIN HFA) 108 (90 Base) MCG/ACT inhaler Inhale 2 puffs into the lungs every 6 (six) hours as needed for wheezing or shortness of breath.   Yes [provider]  Cyanocobalamin (VITAMIN B-12 PO) Take 1 tablet by mouth daily.   Yes [provider]  escitalopram (LEXAPRO) 10 MG  tablet Take 10 mg by mouth daily after breakfast. 02/27/17  Yes [provider]  loratadine (CLARITIN) 10 MG tablet Take 10 mg by mouth daily.   Yes [provider]  losartan (COZAAR) 25 MG tablet Take 1 tablet (25 mg total) by mouth daily. 11/15/17  Yes Patrecia Pour, Christean Grief, MD  metoprolol succinate (TOPROL-XL) 50 MG 24 hr tablet Take 50 mg by mouth daily. Take with or immediately following a meal.   Yes [provider]  mometasone-formoterol (DULERA) 100-5 MCG/ACT AERO Inhale 2 puffs into the lungs 2 (two) times daily.   Yes [provider]  promethazine (PHENERGAN) 25 MG suppository Place 25 mg rectally every 6 (six) hours as needed for nausea or vomiting.  09/11/17  Yes [provider]  promethazine (PHENERGAN) 25 MG tablet Take 25 mg by mouth every 6 (six) hours as needed for nausea or vomiting.  03/28/18  Yes [provider]  pantoprazole (PROTONIX) 40 MG tablet Take 1 tablet (40 mg total) by mouth daily. Patient not taking: Reported on 04/01/2018 11/15/17   Patrecia Pour, Christean Grief, MD   Physical Exam: Blood pressure 95/72, pulse 83, temperature 98.5 F (36.9 C), resp. rate 15, SpO2 99 %. 1. General:  in    Acute distress agitated   acutely ill -appearing 2. Psychological: Alert but not  Oriented 3. Head/ENT:     Dry Mucous Membranes                          Head Non traumatic, neck supple                           Poor Dentition 4. SKIN:   decreased Skin turgor,  Skin clean Dry and intact no rash 5. Heart: Regular rate and rhythm no Murmur, no Rub or gallop 6. Lungs:  no wheezes or crackles   7. Abdomen: Soft, non-tender, Non distended  obese  bowel sounds present 8. Lower extremities: no clubbing, cyanosis, no   edema 9. Neurologically Grossly intact, moving all 4 extremities equally   10. MSK: Normal range of motion   LABS:     Recent Labs  Lab 04/01/18 1818  WBC 14.3*  HGB 15.5*  HCT 45.7  MCV 86.9  PLT 297   Basic Metabolic Panel: Recent Labs  Lab 04/01/18 1818  NA 124*  K 3.1*  CL 85*  CO2 22  GLUCOSE 90  BUN 22  CREATININE 1.03*  CALCIUM 10.2  MG 1.9     Recent Labs  Lab 04/01/18 1818  AST 27  ALT 21  ALKPHOS 57  BILITOT 1.0  PROT 6.6  ALBUMIN 3.9   No results for  input(s): LIPASE, AMYLASE in the last 168 hours. Recent Labs  Lab 04/01/18 1834  AMMONIA 21      HbA1C: No results for input(s): HGBA1C in the last 72 hours. CBG: Recent Labs  Lab 04/01/18 1836  GLUCAP 80      Urine analysis:    Component Value Date/Time   COLORURINE STRAW (A) 04/01/2018 1957   APPEARANCEUR CLEAR 04/01/2018 1957   LABSPEC 1.006 04/01/2018 1957   PHURINE 7.0 04/01/2018 1957   GLUCOSEU NEGATIVE 04/01/2018 1957   HGBUR SMALL (A) 04/01/2018 1957   BILIRUBINUR NEGATIVE 04/01/2018 1957   KETONESUR 5 (A) 04/01/2018 1957   PROTEINUR NEGATIVE 04/01/2018 1957   UROBILINOGEN 0.2 09/10/2010 0951   NITRITE NEGATIVE 04/01/2018 1957  LEUKOCYTESUR MODERATE (A) 04/01/2018 1957       Cultures:    Component Value Date/Time   SDES BLOOD RIGHT HAND 09/13/2017 0156   SPECREQUEST  09/13/2017 0156    BOTTLES DRAWN AEROBIC AND ANAEROBIC Blood Culture results may not be optimal due to an inadequate volume of blood received in culture bottles   CULT  09/13/2017 0156    NO GROWTH 5 DAYS Performed at [facility], [place] [address], [place], [place] [address]    REPTSTATUS 09/18/2017 FINAL 09/13/2017 0156     Radiological Exams on Admission: Dg Chest 1 View  Result Date: 04/01/2018 CLINICAL DATA:  Altered mental status EXAM: CHEST  1 VIEW COMPARISON:  09/13/2017 FINDINGS: Heart is borderline in size. No confluent airspace opacities, effusions or edema. No acute bony abnormality. IMPRESSION: No active disease. Electronically Signed   By: Rolm Baptise M.D.   On: 04/01/2018 20:15   Ct Head Wo Contrast  Result Date: 04/01/2018 CLINICAL DATA:  Altered mental status EXAM: CT HEAD WITHOUT CONTRAST TECHNIQUE: Contiguous axial images were obtained from the base of the skull through the vertex without intravenous contrast. COMPARISON:  09/13/2017 FINDINGS: Brain: No acute intracranial abnormality. Specifically, no hemorrhage, hydrocephalus, mass lesion, acute infarction, or  significant intracranial injury. Vascular: No hyperdense vessel or unexpected calcification. Skull: No acute calvarial abnormality. Sinuses/Orbits: No acute finding Other: None IMPRESSION: No intracranial abnormality. Electronically Signed   By: Rolm Baptise M.D.   On: 04/01/2018 19:18    Chart has been reviewed   Assessment/Plan   63 y.o. female with medical history significant of   chronic diastolic CHF, hypertension, seizure disorder, alcohol and THC abuse depression, recurrent nausea and vomiting Admitted for acute metabolic encephalopathy in the setting of hyponatremia but also possible seizure disorder, dehydration  Present on Admission: . Acute encephalopathy -   - most likely multifactorial secondary to combination of hypertension and mild dehydration secondary to decreased by mouth intake, patient also have a history of seizure disorder currently not on seizure medications.  Also history of substance abuse such as marijuana and possibly alcohol  - Will rehydrate  - treat underlining infection   -Given history of seizures will load with Keppra and see if improves if does not have discussed with neurology who will attempt to obtain EEG  - if no improvement may need further imaging to evaluate for CNS pathology pathology such as MRI of the brain defer to neurology  - neurological exam appears to be nonfocal but patient unable to cooperate fully   - VBG ordered - no history of liver disease ammonia unremarkable  . Chronic diastolic CHF (congestive heart failure) (HCC) currently appears to be dehydrated will rehydrate and follow fluid status . HTN (hypertension) -hold home medications given soft blood pressures in ER . Hyponatremia -in the setting of dehydration obtain urine electrolytes rehydrate follow fluid status follow sodium levels with serial B met . Nausea & vomiting -possibly gastroenteritis given recent history of fever and sick contacts.  Supportive management given febrile  illness and hypotension for now added IV antibiotics ceftriaxone and metronidazole.  Await results of blood cultures . Dehydration -  - we'll administer IV fluids and recheck orthostatics in the morning SIRS -   -Patient meets sepsis criteria with  Hx of  fever      leukocytosis      Initial lactic acid Lactic Acid, Venous    Component Value Date/Time   LATICACIDVEN 2.2 [facility]) 04/01/2018 1957   Source most  likely: Enteritis -We will rehydrate, treat with IV antibiotics, follow lactic acid - Await results of blood and urine culture and adjust antibiotics as needed    . Leukocytosis -we will continue to trend and treat underlying infectious process . Hypokalemia - - will replace and repeat in AM,  check magnesium level and replace as needed   . Tetrahydrocannabinol (THC) use disorder, mild, abuse spoke with family about importance of cutting down family states she uses for nephritis and has been doing this for years this could be contributing to cyclic nausea and vomiting should probably need to be reevaluated by GI to see if there is any other source . Prolonged QT interval - - will monitor on tele avoid QT prolonging medications, rehydrate correct electrolytes  History of alcohol abuse family states she only drinks few alcoholic drinks per week.  Have not had any for past 5 days.  Will monitor for any signs of withdrawal order CIWA protocol Replace thiamine aggressively as per neurology given significant delirium   Other plan as per orders.  DVT prophylaxis:  SCD       Code Status:  FULL CODE  as per  family  I had personally discussed CODE STATUS with  Family     Family Communication:   Family  at  Bedside  plan of care was discussed with  Husband,   Disposition Plan:  To home once workup is complete and patient is stable                    Would benefit from PT/OT eval prior to [place] called: none  Admission status:  Obs       Level of care          SDU tele indefinitely please discontinue once patient no longer qualifies        Ermie Glendenning 04/01/2018, 11:41 PM    [facility]     after 2 AM please page floor coverage PA If 7AM-7PM, please contact the day team taking care of the patient using [email or web address]

## 2018-04-01 NOTE — ED Triage Notes (Signed)
Per [facility], pt from home w/ a c/o a sudden onset of an AMS that began at ~1500 today. Family stated that she has had four other episodes this year where she had an AMS related to dehydration. Pt presented as upset and combative. She continuously attempted to remove her seatbelts and would not allow EMS to perform any treatments. EMS reported that the house smelled of marijuana. Husband stated that the pt had not done any drugs.   100/60 HR 82 CBG 120 100% RA

## 2018-04-02 ENCOUNTER — Observation Stay (HOSPITAL_COMMUNITY): Payer: Self-pay

## 2018-04-02 ENCOUNTER — Encounter (HOSPITAL_COMMUNITY): Payer: Self-pay

## 2018-04-02 DIAGNOSIS — R4182 Altered mental status, unspecified: Secondary | ICD-10-CM

## 2018-04-02 DIAGNOSIS — E869 Volume depletion, unspecified: Secondary | ICD-10-CM

## 2018-04-02 LAB — TROPONIN I
Troponin I: 0.1 ng/mL (ref <0.03)
Troponin I: 0.1 ng/mL (ref <0.03)
Troponin I: 0.08 ng/mL (ref <0.03)

## 2018-04-02 LAB — CBC
HCT: 37.4 % (ref 36.0–46.0)
Hemoglobin: 12.3 g/dL (ref 12.0–15.0)
MCH: 29.1 pg (ref 26.0–34.0)
MCHC: 32.9 g/dL (ref 30.0–36.0)
MCV: 88.6 fL (ref 80.0–100.0)
Platelets: 206 10*3/uL (ref 150–400)
RBC: 4.22 MIL/uL (ref 3.87–5.11)
RDW: 13.2 % (ref 11.5–15.5)
WBC: 7.4 10*3/uL (ref 4.0–10.5)
nRBC: 0 % (ref 0.0–0.2)

## 2018-04-02 LAB — COMPREHENSIVE METABOLIC PANEL
ALBUMIN: 2.9 g/dL — AB (ref 3.5–5.0)
ALT: 16 U/L (ref 0–44)
AST: 20 U/L (ref 15–41)
Alkaline Phosphatase: 43 U/L (ref 38–126)
Anion gap: 13 (ref 5–15)
BUN: 17 mg/dL (ref 8–23)
CHLORIDE: 98 mmol/L (ref 98–111)
CO2: 21 mmol/L — AB (ref 22–32)
Calcium: 8.4 mg/dL — ABNORMAL LOW (ref 8.9–10.3)
Creatinine, Ser: 0.86 mg/dL (ref 0.44–1.00)
GFR calc Af Amer: >60 mL/min (ref >60)
GFR calc non Af Amer: >60 mL/min (ref >60)
GLUCOSE: 86 mg/dL (ref 70–99)
Potassium: 3.1 mmol/L — ABNORMAL LOW (ref 3.5–5.1)
SODIUM: 132 mmol/L — AB (ref 135–145)
Total Bilirubin: 0.9 mg/dL (ref 0.3–1.2)
Total Protein: 5 g/dL — ABNORMAL LOW (ref 6.5–8.1)

## 2018-04-02 LAB — BASIC METABOLIC PANEL
Anion gap: 10 (ref 5–15)
Anion gap: 8 (ref 5–15)
Anion gap: 7 (ref 5–15)
Anion gap: 8 (ref 5–15)
BUN: 18 mg/dL (ref 8–23)
BUN: 15 mg/dL (ref 8–23)
BUN: 13 mg/dL (ref 8–23)
BUN: 12 mg/dL (ref 8–23)
CALCIUM: 8.4 mg/dL — AB (ref 8.9–10.3)
CHLORIDE: 105 mmol/L (ref 98–111)
CHLORIDE: 105 mmol/L (ref 98–111)
CHLORIDE: 95 mmol/L — AB (ref 98–111)
CO2: 22 mmol/L (ref 22–32)
CO2: 23 mmol/L (ref 22–32)
CO2: 25 mmol/L (ref 22–32)
CO2: 26 mmol/L (ref 22–32)
Calcium: 8.9 mg/dL (ref 8.9–10.3)
Calcium: 8.4 mg/dL — ABNORMAL LOW (ref 8.9–10.3)
Calcium: 8.4 mg/dL — ABNORMAL LOW (ref 8.9–10.3)
Chloride: 101 mmol/L (ref 98–111)
Creatinine, Ser: 0.88 mg/dL (ref 0.44–1.00)
Creatinine, Ser: 0.81 mg/dL (ref 0.44–1.00)
Creatinine, Ser: 0.76 mg/dL (ref 0.44–1.00)
Creatinine, Ser: 0.77 mg/dL (ref 0.44–1.00)
GFR calc Af Amer: >60 mL/min (ref >60)
GFR calc Af Amer: >60 mL/min (ref >60)
GFR calc Af Amer: >60 mL/min (ref >60)
GFR calc Af Amer: >60 mL/min (ref >60)
GFR calc non Af Amer: >60 mL/min (ref >60)
GFR calc non Af Amer: >60 mL/min (ref >60)
GFR calc non Af Amer: >60 mL/min (ref >60)
GFR calc non Af Amer: >60 mL/min (ref >60)
Glucose, Bld: 87 mg/dL (ref 70–99)
Glucose, Bld: 83 mg/dL (ref 70–99)
Glucose, Bld: 88 mg/dL (ref 70–99)
Glucose, Bld: 77 mg/dL (ref 70–99)
Potassium: 3 mmol/L — ABNORMAL LOW (ref 3.5–5.1)
Potassium: 3.3 mmol/L — ABNORMAL LOW (ref 3.5–5.1)
Potassium: 3.4 mmol/L — ABNORMAL LOW (ref 3.5–5.1)
Potassium: 3.7 mmol/L (ref 3.5–5.1)
SODIUM: 134 mmol/L — AB (ref 135–145)
Sodium: 131 mmol/L — ABNORMAL LOW (ref 135–145)
Sodium: 135 mmol/L (ref 135–145)
Sodium: 135 mmol/L (ref 135–145)

## 2018-04-02 LAB — PROCALCITONIN: Procalcitonin: <0.1 ng/mL

## 2018-04-02 LAB — LACTIC ACID, PLASMA
Lactic Acid, Venous: 1.6 mmol/L (ref 0.5–1.9)
Lactic Acid, Venous: 0.9 mmol/L (ref 0.5–1.9)
Lactic Acid, Venous: 0.8 mmol/L (ref 0.5–1.9)

## 2018-04-02 LAB — PHOSPHORUS
Phosphorus: 3 mg/dL (ref 2.5–4.6)
Phosphorus: 3.3 mg/dL (ref 2.5–4.6)

## 2018-04-02 LAB — MAGNESIUM: Magnesium: 2 mg/dL (ref 1.7–2.4)

## 2018-04-02 LAB — PROTIME-INR
INR: 1.08
Prothrombin Time: 13.9 seconds (ref 11.4–15.2)

## 2018-04-02 LAB — APTT: aPTT: 22 seconds — ABNORMAL LOW (ref 24–36)

## 2018-04-02 LAB — OSMOLALITY: OSMOLALITY: 272 mosm/kg — AB (ref 275–295)

## 2018-04-02 LAB — TSH: TSH: 2.359 u[IU]/mL (ref 0.350–4.500)

## 2018-04-02 LAB — GLUCOSE, CAPILLARY: Glucose-Capillary: 80 mg/dL (ref 70–99)

## 2018-04-02 MED ORDER — POTASSIUM CHLORIDE 10 MEQ/100ML IV SOLN
10.0000 meq | INTRAVENOUS | Status: AC
  Administered 2018-04-02 (×3): 10 meq via INTRAVENOUS
  Filled 2018-04-02 (×4): qty 100

## 2018-04-02 MED ORDER — LACTATED RINGERS IV BOLUS
500.0000 mL | Freq: Once | INTRAVENOUS | Status: AC
  Administered 2018-04-02: 05:00:00 via INTRAVENOUS

## 2018-04-02 MED ORDER — SODIUM CHLORIDE 0.9 % IV SOLN
1.0000 g | INTRAVENOUS | Status: DC
  Administered 2018-04-02: 1 g via INTRAVENOUS
  Filled 2018-04-02 (×2): qty 10

## 2018-04-02 MED ORDER — SODIUM CHLORIDE 0.9 % IV BOLUS (SEPSIS)
500.0000 mL | Freq: Once | INTRAVENOUS | Status: AC
  Administered 2018-04-02: 500 mL via INTRAVENOUS

## 2018-04-02 NOTE — Procedures (Signed)
History: 62 year old female being evaluated for encephalopathy  Sedation: None  Technique: This is a 21 channel routine scalp EEG performed at the bedside with bipolar and monopolar montages arranged in accordance to the international 10/20 system of electrode placement. One channel was dedicated to EKG recording.    Background: The predominance of this EEG is recorded sleep.  With drowsiness, there is intermittent rhythmic frontally predominant delta activity(FIRDA).  There is also, during the brief period of wakefulness, generalized intrusion of delta and the background.  There is a posterior dominant rhythm that is briefly recorded with a frequency of 9 Hz.  Photic stimulation: Physiologic driving is not performed  EEG Abnormalities: 1) FIRDA 2) generalized irregular delta activity  Clinical Interpretation: This EEG is consistent with a mild nonspecific generalized cerebral dysfunction (encephalopathy). There was no seizure or seizure predisposition recorded on this study. Please note that lack of epileptiform activity on EEG does not preclude the possibility of epilepsy.   Ritta Slot, MD [facility] [phone]  If 7pm- 7am, please page neurology on call as listed in AMION.

## 2018-04-02 NOTE — Progress Notes (Signed)
Pt admitted to unit via stretcher. Pt moved from stretcher to bed by staff. Pt oriented to self. Eyes open, taking arms out of pt gown, trying to cover up with it. Pt stated she had to "go to the bathroom". Staff educated pt on purewick and she was able to hold urine until purewick was placed. Pt had no discomfort, but was drowsy. Will continue to monitor.

## 2018-04-02 NOTE — Progress Notes (Signed)
PROGRESS NOTE  Courtney Miles NVB:[id number] DOB: [birth year] DOA: 04/01/2018 PCP: Gweneth Dimitri, MD  Brief History   The patient is a 63 yr old woman with a past medical history significant for chronic diastolic CHF, hypertension, seizure disorder, depression, and recurrent nausea and vomiting.   The patient presented to alteration of mental status that started at 3:00 pm. The patient was combative upon arrival of EMS and the house smelled strongly of marijuana. The patient's spouse stated that she had had nausea/vomiting and diarrhea for a few days. She had had ocassional fevers. On the day of presentation the patient was feeling a little better and had begun to take PO again. She had not been taking her seizure medications for a few days. The patient drinks 1-2 drinks daily. The patient has had 4 similar presentations in the past year. An EEG was performed in 04/2017 that demonstrated a focal area of cerebral dysfunction in the left occipital region. The patient was seen by Dr. Amada Jupiter at that time and was prescribed Keppra 500mg  bid. She is not taking this.  The patient's spouse states that it is not uncommon for the patient to have episodes of nausea/vomiting that are difficult to control. The patient will then become dehydrated which has been associated with seizure like activity in the past.  Upon arrival to the ED the patient's blood pressure was 80's over 60's. Her heart rate was 82 and she was saturating 100% on room air.   The patient was mildly hyponatremic with Na of 132. Potassium was low at 3.1. Troponin was elevated at 0.1., the second troponin was 0.1 and the third has come down to 0.08.  WBC was 7.4 hemoglobin was 12.3 Hematocrit was 37.4, and platelets were 206. Ct head was negative. The patient has been admitted to a medical bed. UA was performed and was positive for moderate leukocyte esterase.   In the ED the patient was agitated and required Ativan.   She has been  admitted to a telemetry bed. She is receiving Q2 hours neuro checks. She is receiving IV fluids.   A & P  Metabolic encephalopathy: Recurrent with agitation and combative behavior: Pt has been using THC. She has previous EEG that demonstrates a seizure focus for which she has been prescribed, but is not taking, Keppra. EEG has been ordered.  UTI: UA is suggestive of a UTI. Will order urine culture. The patient is receiving rocephin.  Enteritis: The patient had 3 days of nausea/vomiting/diarrhea at home. By all accounts nausea and vomting had resolved and the patient was eating yesterday. She continues to have some diarrhea.  I will stop Flagyl and send stool for studies.  Elevated troponin: Declining. Continue to monitor. EKG demonstrates no ischemic changes. Monitor on telemetry. Repeat EKG.  History of ETOH abuse: None for the past 3 days. Concern for ETOH withdrawal. CIWA protocol. Monitor.  Volume depletion: Resulting in hypotension, hyponatremia. Continue cautious IV fluids. Monitor volume status, electrolyes, and creatinine.  Recurrent issues with nausea and vomiting: Possible cannabis hyperemesis given family report of avid marijuana use.  DVT prophylaxis: SCD Code Status: Full Code Family Communication: None present. Disposition Plan: home   Kailo Kosik, DO [facility] Direct contact: see [email or web address]  7PM-7AM contact night coverage as above 04/02/2018, 1:11 PM  LOS: 0 days   Consultants    Procedures  . EEG  Antibiotics  . Ceftriaxone  Interval History/Subjective  The patient is somnolent. Nursing reports that the patient remains disoriented and confused.  No further agitation of combative behavior.  Objective   Vitals:  Vitals:   04/02/18 0858 04/02/18 1225  BP:  90/60  Pulse: 72 76  Resp: 14 16  Temp:  97.9 F (36.6 C)  SpO2:  98%    Exam:  Constitutional:  . The patient is sleeping comfortably. No acute distress Respiratory:  . No wheezes,  rales, or rhonchi.  . No tactile fremitus. . No increased work of breathing. Cardiovascular:  . Regular rate and rhythm. No murmurs, ectopy, or gallups. . No LE extremity edema   . Normal pedal pulses Abdomen:  . Abdomen is soft, non-tender, non-distended. . No hernias,masses, or organomegaly are appreciated. . Noromoactive bowel sounds.  Musculoskeletal:  . No cyanosis, clubbing, or edema. Skin:  . No rashes, lesions, ulcers . palpation of skin: no induration or nodules Neurologic:  . CN 2-12 intact . Sensation all 4 extremities intact Psychiatric:  . Unable to assess as the patient is sleeping and cannot remain awake except for brief periods.   I have personally reviewed the following:   Today's Data  . CBC, Chemistry, UA, Vitals   Scheduled Meds: . folic acid  1 mg Intravenous Daily  . mometasone-formoterol  2 puff Inhalation BID  . multivitamin with minerals  1 tablet Oral Daily   Continuous Infusions: . cefTRIAXone (ROCEPHIN)  IV    . levETIRAcetam Stopped (04/01/18 2335)  . levETIRAcetam 500 mg (04/02/18 0606)  . metronidazole 500 mg (04/02/18 7915)  . potassium chloride 10 mEq (04/02/18 1258)  . thiamine injection 500 mg (04/02/18 0569)    Active Problems:   Seizure disorder (HCC)   HTN (hypertension)   Hyponatremia   Nausea & vomiting   Dehydration   Leukocytosis   Hypokalemia   Prolonged QT interval   Acute metabolic encephalopathy   Chronic diastolic CHF (congestive heart failure) (HCC)   Acute encephalopathy   Tetrahydrocannabinol (THC) use disorder, mild, abuse   SIRS (systemic inflammatory response syndrome) (HCC)   Volume depletion  LOS: 0 days

## 2018-04-02 NOTE — Progress Notes (Signed)
PT Cancellation Note  Patient Details Name: Courtney Miles MRN: [id number] DOB: [birth year]   Cancelled Treatment:    Reason Eval/Treat Not Completed: Fatigue/lethargy limiting ability to participate - pt sleeping soundly and spouse at bedside preferred PT wait to next date to attempt therapy eval - we will comply.     Narda Amber [facility] 04/02/2018, 1:49 PM

## 2018-04-02 NOTE — Progress Notes (Signed)
Pt received bolus NS 500 mL, bolus LR 500 mL for low BP. BP stabilized, pt slightly more alert. Oriented to self, stating her last name. Pt states she is in the "bathroom" when asked where she is, and does not answer other orientation questions before closing eyes and going back to sleep. She did once sit up in bed insisting she had to "go to the bathroom" and RN explained the purewick to her, and she repeatedly tried to sit up to "go to the bathroom", and then used the purewick. Afterwards she went back to sleep and aroused to pain, or to repeated attempts to get her her to open her eyes. Reported to dayshift.

## 2018-04-02 NOTE — Progress Notes (Signed)
EEG completed, results pending. 

## 2018-04-03 LAB — BLOOD GAS, VENOUS
ACID-BASE EXCESS: 2.7 mmol/L — AB (ref 0.0–2.0)
Bicarbonate: 26.9 mmol/L (ref 20.0–28.0)
O2 SAT: 95 %
Patient temperature: 98.6
pCO2, Ven: 42.5 mmHg — ABNORMAL LOW (ref 44.0–60.0)
pH, Ven: 7.417 (ref 7.250–7.430)
pO2, Ven: 78.6 mmHg — ABNORMAL HIGH (ref 32.0–45.0)

## 2018-04-03 LAB — BASIC METABOLIC PANEL
Anion gap: 4 — ABNORMAL LOW (ref 5–15)
BUN: 14 mg/dL (ref 8–23)
CO2: 22 mmol/L (ref 22–32)
Calcium: 8.3 mg/dL — ABNORMAL LOW (ref 8.9–10.3)
Chloride: 111 mmol/L (ref 98–111)
Creatinine, Ser: 0.71 mg/dL (ref 0.44–1.00)
GFR calc non Af Amer: >60 mL/min (ref >60)
Glucose, Bld: 90 mg/dL (ref 70–99)
Potassium: 3.4 mmol/L — ABNORMAL LOW (ref 3.5–5.1)
Sodium: 137 mmol/L (ref 135–145)

## 2018-04-03 LAB — URINE CULTURE

## 2018-04-03 MED ORDER — LEVETIRACETAM 500 MG PO TABS: 500.0000 mg | ORAL_TABLET | Freq: Two times a day (BID) | ORAL | 0 refills | Status: AC

## 2018-04-03 MED ORDER — POTASSIUM CHLORIDE CRYS ER 20 MEQ PO TBCR
40.0000 meq | EXTENDED_RELEASE_TABLET | Freq: Once | ORAL | Status: AC
  Administered 2018-04-03: 40 meq via ORAL
  Filled 2018-04-03: qty 2

## 2018-04-03 MED ORDER — ACETAMINOPHEN 325 MG PO TABS: 650.0000 mg | ORAL_TABLET | Freq: Four times a day (QID) | ORAL | Status: DC | PRN

## 2018-04-03 NOTE — Care Management Note (Signed)
Case Management Note  Patient Details  Name: [name] MRN: [id number] Date of Birth: 12-11-1955  Subjective/Objective:      Pt admitted with acute encephalopathy. She is from home with her spouse. DME at home: none No issues obtaining her meds. Denies issues with transportation. No insurance.  PCP: [facility] Sprang on [address]              Action/Plan: Pt doesn't want to change to a [facility] for assistance with PCP cost or medication assistance.  PT recommending outpatient PT. Pt is unsure she wants to attend this. CM provided her the orders and phone numbers to the Outpatient Clinics.  Spouse to provide transportation home.   Expected Discharge Date:  04/03/18               Expected Discharge Plan:  OP Rehab  In-House Referral:     Discharge planning Services  CM Consult  Post Acute Care Choice:    Choice offered to:     DME Arranged:    DME Agency:     HH Arranged:    HH Agency:     Status of Service:  Completed, signed off  If discussed at [employer] of Stay Meetings, dates discussed:    Additional Comments:  Kermit Balo, RN 04/03/2018, 12:14 PM

## 2018-04-03 NOTE — Evaluation (Addendum)
Physical Therapy Evaluation Patient Details Name: Courtney Miles MRN: [id number] DOB: 11-25-1955 Today's Date: 04/03/2018   History of Present Illness  Patient is a 63 y/o female who presents with AMS, N/V/diarrhea. + UTI and + THC. Drinks alcohol daily. Found to have acute encephalopathy in the setting of hyponatremia vs seizure disorder and dehydration. PMH includes HTN, depression, seizures, heart murmur.  Clinical Impression  Patient presents with impaired balance, decreased memory, impaired attention, distractibility and impaired mobility s/p above. Pt independent PTA and lives with spouse, Courtney Miles. Pt jokes excessively and reports her "brain is not working right today." Tolerated gait training with Min A for balance/safety due to staggering in both directions esp with head turns/distractions. Anticipate pt's balance will improve with increased activity and when mentation clears. No family present to determine pt's baseline cognition. Will follow acutely to maximize independence and mobility prior to return home.    Follow Up Recommendations Outpatient PT;Supervision - Intermittent(pending improvement)    Equipment Recommendations  None recommended by PT    Recommendations for Other Services       Precautions / Restrictions Precautions Precautions: Fall Restrictions Weight Bearing Restrictions: No      Mobility  Bed Mobility Overal bed mobility: Needs Assistance Bed Mobility: Supine to Sit     Supine to sit: Modified independent (Device/Increase time);HOB elevated     General bed mobility comments: No assist needed.   Transfers Overall transfer level: Needs assistance Equipment used: None Transfers: Sit to/from Stand Sit to Stand: Min assist         General transfer comment: Assist to steady in standing. Stood from [employer], transferred to chair post ambulation.  Ambulation/Gait Ambulation/Gait assistance: Min assist Gait Distance (Feet): 150 Feet Assistive  device: None Gait Pattern/deviations: Step-through pattern;Decreased stride length;Staggering right;Staggering left;Narrow base of support Gait velocity: decreased   General Gait Details: Slow, unsteady gait with stumbling to right/left esp with head turns or when distracted. VSS. POor awareness of deficits.   Stairs            Wheelchair Mobility    Modified Rankin (Stroke Patients Only)       Balance Overall balance assessment: Needs assistance;History of Falls Sitting-balance support: Feet supported;No upper extremity supported Sitting balance-Leahy Scale: Good Sitting balance - Comments: Able to reach outside BoS and donn socks without difficulty.    Standing balance support: During functional activity Standing balance-Leahy Scale: Fair                               Pertinent Vitals/Pain Pain Assessment: No/denies pain    Home Living Family/patient expects to be discharged to:: Private residence Living Arrangements: Spouse/significant other Available Help at Discharge: Family Type of Home: House Home Access: Stairs to enter Entrance Stairs-Rails: Can reach both Entrance Stairs-Number of Steps: 8 Home Layout: Multi-level Home Equipment: Crutches Additional Comments: Charity fundraiser- owner    Prior Function Level of Independence: Independent         Comments: Patient works as a Education administrator and enjoys taking her dog for walks around her garden.     Hand Dominance   Dominant Hand: Right    Extremity/Trunk Assessment   Upper Extremity Assessment Upper Extremity Assessment: Defer to OT evaluation    Lower Extremity Assessment Lower Extremity Assessment: Generalized weakness       Communication   Communication: No difficulties  Cognition Arousal/Alertness: Awake/alert Behavior During Therapy: WFL for tasks assessed/performed Overall Cognitive Status:  Impaired/Different from baseline Area of Impairment:  Orientation;Memory;Attention;Awareness;Problem solving;Safety/judgement                 Orientation Level: Disoriented to;Situation;Time(roughly knows it is February.) Current Attention Level: Selective Memory: Decreased short-term memory   Safety/Judgement: Decreased awareness of deficits Awareness: Intellectual Problem Solving: Requires verbal cues General Comments: Tangential and all over the place with conversation. Jokes excessively likely baseline. Requires repetition to follow commands. Able to recall 2/3 words during session for memory recall.       General Comments      Exercises     Assessment/Plan    PT Assessment Patient needs continued PT services  PT Problem List Decreased strength;Decreased mobility;Decreased safety awareness;Decreased cognition;Decreased balance       PT Treatment Interventions Functional mobility training;Balance training;Patient/family education;Gait training;Therapeutic activities;Therapeutic exercise;Stair training;Neuromuscular re-education;DME instruction    PT Goals (Current goals can be found in the Care Plan section)  Acute Rehab PT Goals Patient Stated Goal: to feel better PT Goal Formulation: With patient Time For Goal Achievement: 04/17/18 Potential to Achieve Goals: Good    Frequency Min 3X/week   Barriers to discharge Inaccessible home environment stairs to enter home    Co-evaluation               AM-PAC PT "6 Clicks" Mobility  Outcome Measure Help needed turning from your back to your side while in a flat bed without using bedrails?: A Little Help needed moving from lying on your back to sitting on the side of a flat bed without using bedrails?: A Little Help needed moving to and from a bed to a chair (including a wheelchair)?: A Little Help needed standing up from a chair using your arms (e.g., wheelchair or bedside chair)?: A Little Help needed to walk in hospital room?: A Little Help needed climbing 3-5  steps with a railing? : A Little 6 Click Score: 18    End of Session Equipment Utilized During Treatment: Gait belt Activity Tolerance: Patient tolerated treatment well Patient left: in chair;with call bell/phone within reach;with nursing/sitter in room;with chair alarm set Nurse Communication: Mobility status PT Visit Diagnosis: Difficulty in walking, not elsewhere classified (R26.2);Unsteadiness on feet (R26.81)    Time: 4174-0814 PT Time Calculation (min) (ACUTE ONLY): 27 min   Charges:   PT Evaluation $PT Eval Moderate Complexity: 1 Mod PT Treatments $Gait Training: 8-22 mins        Mylo Red, PT, DPT Acute Rehabilitation Services Pager [phone] Office [phone]      Blake Divine A Lanier [facility] 04/03/2018, 8:58 AM

## 2018-04-03 NOTE — Plan of Care (Signed)
Adequate for discharge.

## 2018-04-03 NOTE — Discharge Instructions (Signed)
Confusion °Confusion is the inability to think with the usual speed or clarity. People who are confused often describe their thinking as cloudy or unclear. Confusion can also include feeling disoriented. This means you are unaware of where you are or who you are. You may also not know the date or time. When confused, you may have difficulty remembering, paying attention, or making decisions. Some people also act aggressively when they are confused. °In some cases, confusion may come on quickly. In other cases, it may develop slowly over time. How quickly confusion comes on depends on the cause. °Confusion may be caused by: °· Head injury (concussion). °· Seizures. °· Stroke. °· Fever. °· Brain tumor. °· Decrease in brain function due to a vascular or neurologic condition (dementia). °· Emotions, like rage or terror. °· Inability to know what is real and what is not (hallucinations). °· Infections, such as a urinary tract infection (UTI). °· Using too much alcohol, drugs, or medicines. °· Loss of fluid (dehydration) or an imbalance of salts in the body (electrolytes). °· Lack of sleep. °· Low blood sugar (diabetes). °· Low levels of oxygen. This comes from conditions such as chronic lung disorders. °· Side effects of medicines, or taking medicines that affect other medicines (drug interactions). °· Lack of certain nutrients, especially niacin, thiamine, vitamin C, or vitamin B. °· Sudden drop in body temperature (hypothermia). °· Change in routine, such as traveling or being hospitalized. °Follow these instructions at home: °Pay attention to your symptoms. Tell your health care provider about any changes or if you develop new symptoms. Follow these instructions to control or treat symptoms. Ask a family member or friend for help if needed. °Medicines °· Take over-the-counter and prescription medicines only as told by your health care provider. °· Ask your health care provider about changing or stopping any medicines  that may be causing your confusion. °· Avoid pain medicines or sleep medicines until you have fully recovered. °· Use a pillbox or an alarm to help you take the right medicines at the right time. °Lifestyle ° °· Eat a balanced diet that includes fruits and vegetables. °· Get enough sleep. For most adults, this is 7-9 hours each night. °· Do not drink alcohol. °· Do not become isolated. Spend time with other people and make plans for your days. °· Do not drive until your health care provider says that it is safe to do so. °· Do not use any products that contain nicotine or tobacco, such as cigarettes and e-cigarettes. If you need help quitting, ask your health care provider. °· Stop other activities that may increase your chances of getting hurt. These may include some work duties, sports activities, swimming, or bike riding. Ask your health care provider what activities are safe for you. °What caregivers can do °· Find out if the person is confused. Ask the person to state his or her name, age, and the date. If the person is unsure or answers incorrectly, he or she may be confused. °· Always introduce yourself, no matter how well the person knows you. °· Remind the person of his or her location. Do this often. °· Place a calendar and clock near the person who is confused. °· Talk about current events and plans for the day. °· Keep the environment calm, quiet, and peaceful. °· Help the person do the things that he or she is unable to do. These include: °? Taking medicines. °? Keeping follow-up visits with his or her health care   provider. ? Helping with household duties, including meal preparation. ? Running errands.  Get help if you need it. There are several support groups for caregivers.  If the person you are helping needs more support, consider day care, extended care programs, or a skilled nursing facility. The person's health care provider may be able to help evaluate these options. General  instructions  Monitor yourself for any conditions you may have. These may include: ? Checking your blood glucose levels, if you have diabetes. ? Watching your weight, if you are overweight. ? Monitoring your blood pressure, if you have hypertension. ? Monitoring your body temperature, if you have a fever.  Keep all follow-up visits as told by your health care provider. This is important. Contact a health care provider if:  Your symptoms get worse. Get help right away if you:  Feel that you are not able to care for yourself.  Develop severe headaches, repeated vomiting, seizures, blackouts, or slurred speech.  Have increasing confusion, weakness, numbness, restlessness, or personality changes.  Develop a loss of balance, have marked dizziness, feel uncoordinated, or fall.  Develop severe anxiety, or you have delusions or hallucinations. These symptoms may represent a serious problem that is an emergency. Do not wait to see if the symptoms will go away. Get medical help right away. Call your local emergency services (911 in the [place] Do not drive yourself to the hospital. Summary  Confusion is the inability to think with the usual speed or clarity. People who are confused often describe their thinking as cloudy or unclear.  Confusion can also include having difficulty remembering, paying attention, or making decisions.  Confusion may come on quickly or develop slowly over time, depending on the cause. There are many different causes of confusion.  Ask for help from family members or friends if you are unable to take care of yourself. This information is not intended to replace advice given to you by your health care provider. Make sure you discuss any questions you have with your health care provider. Document Released: 03/04/2004 Document Revised: 01/27/2017 Document Reviewed: 01/27/2017 Elsevier Interactive Patient Education  2019 [employer].   Seizure, Adult A seizure is a  sudden burst of abnormal electrical activity in the brain. The abnormal activity temporarily interrupts normal brain function, causing a person to experience any of the following:  Involuntary movements.  Changes in awareness or consciousness.  Uncontrollable shaking (convulsions). Seizures usually last from 30 seconds to 2 minutes. They usually do not cause permanent brain damage unless they are prolonged. What are the causes? This condition may be caused by:  Fever.  Low blood sugar.  Medicine.  Illness.  Brain injury.  Brain tumor.  Stroke.  A condition that is passed from parent to child (genetic).  Addiction to a substance (substanceuse disorder) or suddenly stopping the use of a substance (withdrawal). Some people who have a seizure never have another one. People who have repeated seizures have a condition called epilepsy. What are the signs or symptoms? Symptoms of this condition vary greatly from person to person. They include:  Convulsions.  Stiffening of the body.  Involuntary movements of the arms or legs.  Loss of consciousness.  Breathing problems.  Falling suddenly.  Confusion.  Head nodding.  Eye blinking or fluttering.  Lip smacking or tongue biting.  Drooling.  Rapid eye movements.  Grunting.  Loss of bladder control and bowel control.  Staring.  Unresponsiveness. Some people have symptoms right before a seizure happens (aura)  and right after a seizure happens.  Symptoms that may occur before a seizure include: ? Fear or anxiety. ? Nausea. ? Feeling like the room is spinning (vertigo). ? A feeling of having seen or heard something before (dj vu). ? Odd tastes or smells. ? Changes in vision, such as seeing flashing lights or spots.  Symptoms that may occur after a seizure include: ? Confusion. ? Sleepiness. ? Headache. ? Weakness on one side of the body. How is this diagnosed? This condition may be diagnosed based on  medical history and physical exam. You may also have other tests, including:  Blood tests.  Electroencephalogram, EEG.  CT scan.  MRI.  Spinal tap (lumbar puncture). How is this treated? Most seizures will stop on their own in under 5 minutes and no treatment is needed. Seizures lasting longer than 5 minutes will usually need treatment. Treatment includes:  Medicines given through IV.  Avoiding known triggers, such as medicines that you take for another condition.  Medicines to treat epilepsy (antiepileptics), if epilepsy caused your seizures.  Surgery to stop seizures, if you have epilepsy that does not respond to medicines. Follow these instructions at home: Medicines  Take over-the-counter and prescription medicines only as told by your health care provider.  Avoid any substances that may prevent your medicine from working properly, such as alcohol. Activity  Do not drive, swim, or do any other activities that would be dangerous if you had another seizure. Wait until your health care provider says it is safe to do them.  If you live in the [place] check with your local DMV (department of motor vehicles) to find out about local driving laws. Each state has specific rules about when you can legally return to driving.  Get enough rest. Lack of sleep can make seizures more likely to occur. Educating others Teach friends and family what to do if you have a seizure. They should:  Lay you on the ground to prevent a fall.  Cushion your head and body.  Loosen any tight clothing around your neck.  Turn you on your side. If vomiting occurs, this helps keep your airway clear.  Not hold you down. Holding you down will not stop the seizure.  Not put anything into your mouth.  Know whether or not you need emergency care.  Stay with you until you recover.  General instructions  Contact your health care provider each time you have a seizure.  Avoid anything that has ever  triggered a seizure for you.  Keep a seizure diary. Record what you remember about each seizure, especially anything that might have triggered the seizure.  Keep all follow-up visits as told by your health care provider. This is important. Contact a health care provider if:  You have another seizure.  You have seizures more often.  Your seizure symptoms change.  You continue to have seizures with treatment.  You have symptoms of an infection or illness. These might increase your risk of having a seizure. Get help right away if:  You have a seizure that: ? Lasts longer than 5 minutes. ? Is different than previous seizures. ? Leaves you unable to speak or use a part of your body. ? Makes it harder to breathe.  You have: ? A seizure after a head injury. ? Multiple seizures in a row. ? Confusion or a severe headache right after a seizure.  You are having seizures more often.  You do not wake up immediately after a seizure.  You injure yourself during a seizure. These symptoms may represent a serious problem that is an emergency. Do not wait to see if the symptoms will go away. Get medical help right away. Call your local emergency services (911 in the [place] Do not drive yourself to the hospital. Summary  Seizures are caused by abnormal electrical activity in the brain. The activity disrupts normal brain function, leading to a change in consciousness, abnormal movements, or convulsions.  There are many causes of seizures including illnesses, medicines, genetic conditions, head injuries, strokes, tumors, substance abuse, or substance withdrawal.  Most seizures will stop on their own in under 5 minutes. Seizures lasting longer than 5 minutes are a medical emergency and require immediate treatment.  There are many medicines that are used to treat seizures. Take over-the-counter and prescription medicines only as told by your health care provider. This information is not intended  to replace advice given to you by your health care provider. Make sure you discuss any questions you have with your health care provider. Document Released: 01/23/2000 Document Revised: 03/03/2017 Document Reviewed: 03/03/2017 Elsevier Interactive Patient Education  2019 [employer].

## 2018-04-03 NOTE — Discharge Summary (Signed)
DISCHARGE SUMMARY  Courtney Miles  MR#: [id number]  DOB:28-Mar-1955  Date of Admission: 04/01/2018 Date of Discharge: 04/03/2018  Attending Physician:Morissa Obeirne Silvestre Gunner, MD  Patient's HQI:[id number], Toniann Fail, MD  Consults: none  Disposition: D/C home w/ husband   Follow-up Appts: Follow-up Information    Gweneth Dimitri, MD Follow up in 1 week(s).   Specialty:  Family Medicine Contact information: [address] [place] [address] [phone]           Discharge Diagnoses: Metabolic encephalopathy of unclear etiology Seizure disorder w/ possible seizure at home  Enteritis v/s Cyclic Vomiting Syndrome  Possible cannabis hyperemesis Hypokalemia  History of ETOHabuse Volume depletion Hypotension Hyponatremia    Initial presentation: 62yo w/ chronic diastolic CHF, hypertension, seizure disorder, depression, and recurrent nausea and vomiting who presented w/ AMS. The patient was combative upon arrival of EMS who reported the house smelled strongly of marijuana. The spouse reported nausea/vomiting and diarrhea for a few days. She had not been taking her seizure medications for a few days, and drinks 1-2 drinks daily. The patient has had 4 similar presentations in the past year. An EEG 04/2017 that demonstrated a focal area of cerebral dysfunction in the left occipital region.   In the ED the patient's blood pressure was in the 80s. She was mildly hyponatremic with Na of 132. Potassium was 3.1. WBC was 7.4 hemoglobin was 12.3. CT head was negative. UA was positive for moderate leukocyte esterase.   Hospital Course:  Metabolic encephalopathy Recurrent with agitation and combative behavior - resolved at time of d/c as per spouse at bedside - pt strongly desires d/c home, as does husband - she denies ha, cp, sob, or current N/V - understands need to take her meds consistently, and to avoid substances of abuse   Seizure disorder A previous EEG demonstrated a seizure  focus for which she has been prescribed, but is not taking Keppra - EEG this admit unrevealing - advised to comply w/ Keppr   ?UTI - doubt  UA was not normal, but not strongly suggestive of a UTI in the setting of signif DH - urine culture not helpful - stopped abx as there were multiple other potential etiologies of her AMS  Enteritis 3 days of nausea/vomiting/diarrhea at home - sx had stopped just prior to admit - suggestive of cyclical vomiting syndrome - ?due to marijuana hyperemesis syndrome - advised to avoid all substances of abuse - able to tolerate oral intake at time of d/c   Hypokalemia  Due to vomiting and poor intake - supplemented prior to d/c - should normalize w/ return to usual intake   History of ETOHabuse Pt had not had a drink for 3 days prior to admit, suggesting ETOHwithdrawal, but husband reported at admission that pt does not actually drink on a daily basis, making withdrawal less likely - given speed of improvement I do not think her AMS was due to EtOH withdrawal this admit   Volume depletion - Hypotension - Hyponatremia Corrected w/ IVF resuscitation    Allergies as of 04/03/2018      Reactions   Sulfa Antibiotics Other (See Comments)   Turned bright red      Medication List    TAKE these medications   acetaminophen 500 MG tablet Commonly known as:  TYLENOL Take 1,000 mg by mouth every 6 (six) hours as needed for headache (pain).   albuterol 108 (90 Base) MCG/ACT inhaler Commonly known as:  PROVENTIL HFA;VENTOLIN HFA Inhale 2 puffs into  the lungs every 6 (six) hours as needed for wheezing or shortness of breath.   escitalopram 10 MG tablet Commonly known as:  LEXAPRO Take 10 mg by mouth daily after breakfast.   levETIRAcetam 500 MG tablet Commonly known as:  KEPPRA Take 1 tablet (500 mg total) by mouth 2 (two) times daily.   loratadine 10 MG tablet Commonly known as:  CLARITIN Take 10 mg by mouth daily.   metoprolol succinate 50 MG 24 hr  tablet Commonly known as:  TOPROL-XL Take 50 mg by mouth daily. Take with or immediately following a meal.   mometasone-formoterol 100-5 MCG/ACT Aero Commonly known as:  DULERA Inhale 2 puffs into the lungs 2 (two) times daily.   promethazine 25 MG suppository Commonly known as:  PHENERGAN Place 25 mg rectally every 6 (six) hours as needed for nausea or vomiting.   promethazine 25 MG tablet Commonly known as:  PHENERGAN Take 25 mg by mouth every 6 (six) hours as needed for nausea or vomiting.   VITAMIN B-12 PO Take 1 tablet by mouth daily.       Day of Discharge BP 140/84 (BP Location: Left Arm)   Pulse 78   Temp 97.7 F (36.5 C) (Oral)   Resp 18   Ht  (1.575 m)   Wt 63.5 kg   SpO2 96%   BMI 25.60 kg/m   Physical Exam: General: No acute respiratory distress Lungs: Clear to auscultation bilaterally without wheezes or crackles Cardiovascular: Regular rate and rhythm without murmur gallop or rub normal S1 and S2 Abdomen: Nontender, nondistended, soft, bowel sounds positive, no rebound, no ascites, no appreciable mass Extremities: No significant cyanosis, clubbing, or edema bilateral lower extremities  Basic Metabolic Panel: Recent Labs  Lab 04/01/18 1818 04/02/18 0047 04/02/18 0354 04/02/18 0612 04/02/18 1142 04/02/18 1822 04/02/18 2345  NA 124* 131* 132* 134* 135 135 137  K 3.1* 3.0* 3.1* 3.3* 3.4* 3.7 3.4*  CL 85* 95* 98 101 105 105 111  CO2 22 26 21* GLUCOSE 90 87 86 83 88 77 90  BUN CREATININE 1.03* 0.88 0.86 0.81 0.76 0.77 0.71  CALCIUM 10.2 8.9 8.4* 8.4* 8.4* 8.4* 8.3*  MG 1.9  --  2.0  --   --   --   --   PHOS  --  3.0 3.3  --   --   --   --     Liver Function Tests: Recent Labs  Lab 04/01/18 1818 04/02/18 0354  AST 27 20  ALT 21 16  ALKPHOS 57 43  BILITOT 1.0 0.9  PROT 6.6 5.0*  ALBUMIN 3.9 2.9*    Recent Labs  Lab 04/01/18 1834  AMMONIA 21    Coags: Recent Labs  Lab 04/02/18 0048  INR  1.08    CBC: Recent Labs  Lab 04/01/18 1818 04/02/18 0354  WBC 14.3* 7.4  HGB 15.5* 12.3  HCT 45.7 37.4  MCV 86.9 88.6  PLT 267 206    Cardiac Enzymes: Recent Labs  Lab 04/02/18 0047 04/02/18 0354 04/02/18 0916  TROPONINI 0.10* 0.10* 0.08*     CBG: Recent Labs  Lab 04/01/18 1836 04/02/18 0103  GLUCAP 80 80    Recent Results (from the past 240 hour(s))  Urine Culture     Status: Abnormal   Collection Time: 04/01/18  7:57 PM  Result Value Ref Range Status   Specimen Description URINE, RANDOM  Final  Special Requests   Final    NONE Performed at [facility], [address] [place], [place] [address]    Culture MULTIPLE SPECIES PRESENT, SUGGEST RECOLLECTION (A)  Final   Report Status 04/03/2018 FINAL  Final  Blood culture (routine x 2)     Status: None (Preliminary result)   Collection Time: 04/01/18  9:52 PM  Result Value Ref Range Status   Specimen Description BLOOD LEFT ANTECUBITAL  Final   Special Requests   Final    BOTTLES DRAWN AEROBIC AND ANAEROBIC Blood Culture results may not be optimal due to an inadequate volume of blood received in culture bottles   Culture NO GROWTH < 24 HOURS  Final   Report Status PENDING  Incomplete  Blood culture (routine x 2)     Status: None (Preliminary result)   Collection Time: 04/02/18 12:46 AM  Result Value Ref Range Status   Specimen Description BLOOD LEFT HAND  Final   Special Requests   Final    BOTTLES DRAWN AEROBIC AND ANAEROBIC Blood Culture adequate volume Performed at [facility], [address] [place], [place] [address]    Culture NO GROWTH < 24 HOURS  Final   Report Status PENDING  Incomplete     Time spent in discharge (includes decision making & examination of pt): 30 minutes  04/03/2018, 12:07 PM   Lonia Blood, MD [facility] Office  [phone]

## 2018-04-06 LAB — VITAMIN B1: Vitamin B1 (Thiamine): 193.3 nmol/L (ref 66.5–200.0)

## 2018-04-06 LAB — CULTURE, BLOOD (ROUTINE X 2): Culture: NO GROWTH

## 2018-04-07 LAB — CULTURE, BLOOD (ROUTINE X 2)
Culture: NO GROWTH
Special Requests: ADEQUATE

## 2020-05-05 IMAGING — CT CT HEAD W/O CM
4 series · 16 of 47 positions shown, 18 images · non-contrast
Comparison: CT of the head performed 04/30/2017, and MRI of the
brain performed 05/01/2017

CLINICAL DATA: Acute onset of altered mental status.

EXAM:
CT HEAD WITHOUT CONTRAST
TECHNIQUE: Contiguous axial images were obtained from the base of the skull
through the vertex without intravenous contrast.

[Series 3: head wo · axial · 0.39mm/px · z∈[+732,+847]mm · 7 of 31 slices shown, 9 images]
[im 4/31  brain]
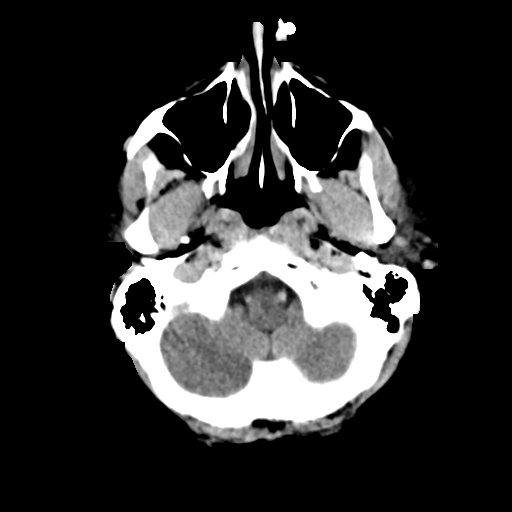
[im 4/31  bone]
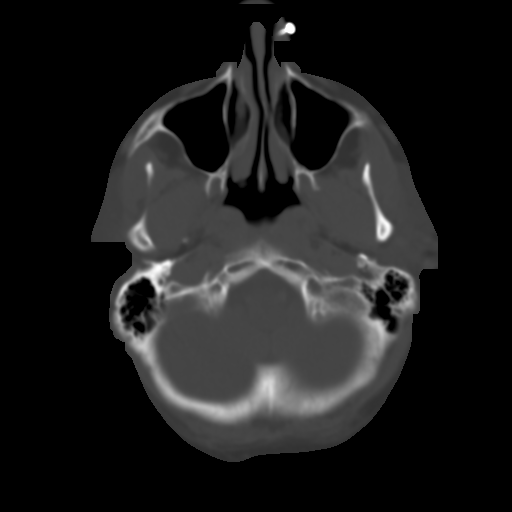
[im 8/31  brain]
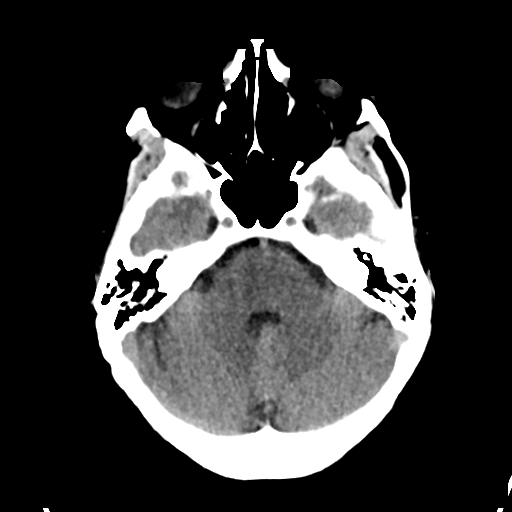
[im 12/31  brain]
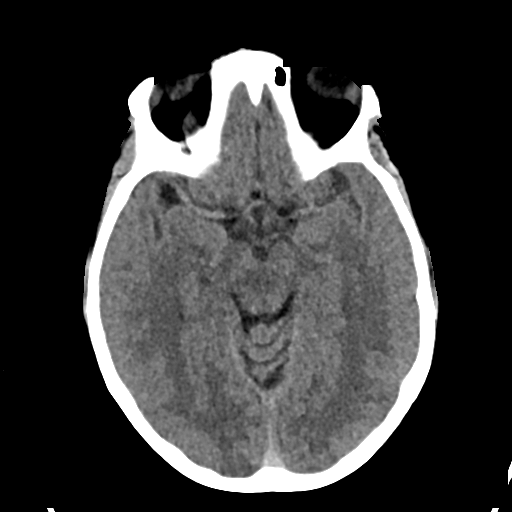
[im 16/31  brain]
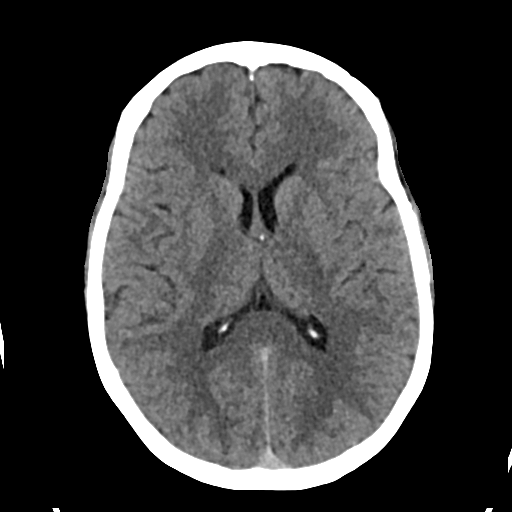
[im 19/31  brain]
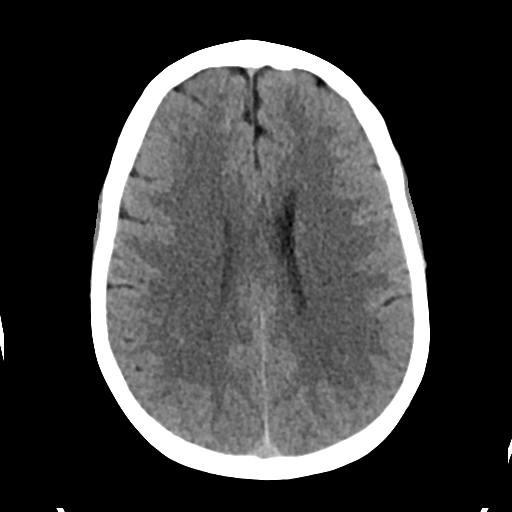
[im 19/31  bone]
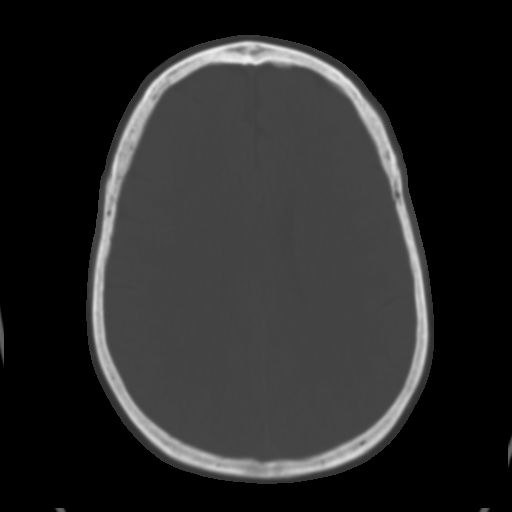
[im 23/31  brain]
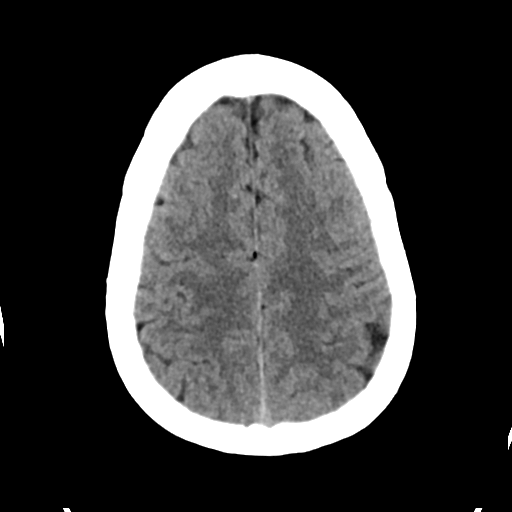
[im 27/31  brain]
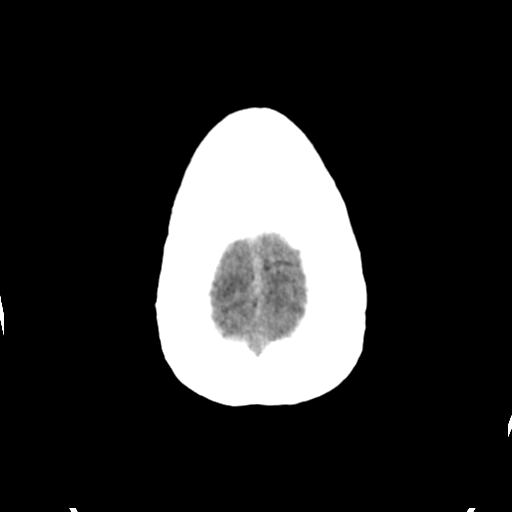

[Series 4: head bone · axial · 0.39mm/px · z∈[+731,+761]mm · 3 of 77 slices shown]
[im 8/77  bone]
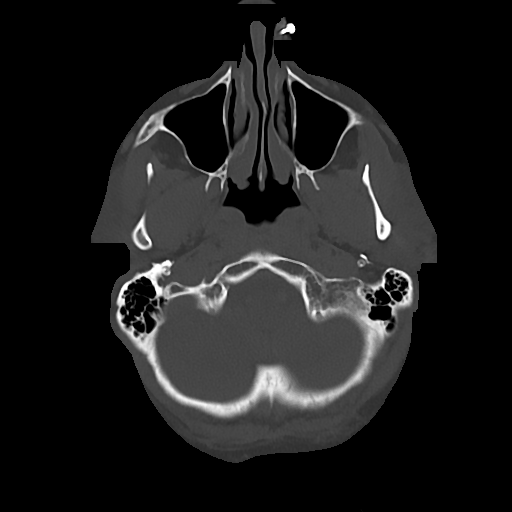
[im 16/77  bone]
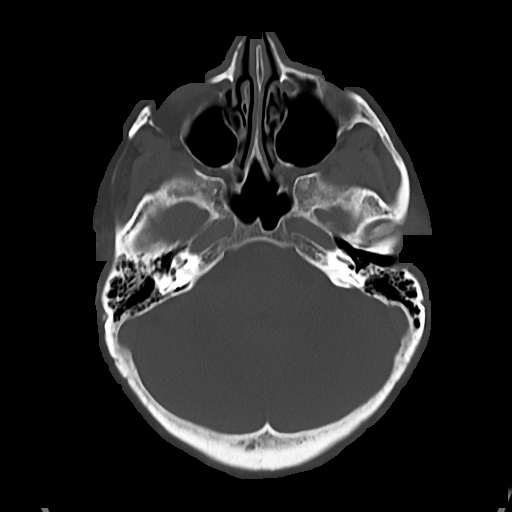
[im 23/77  bone]
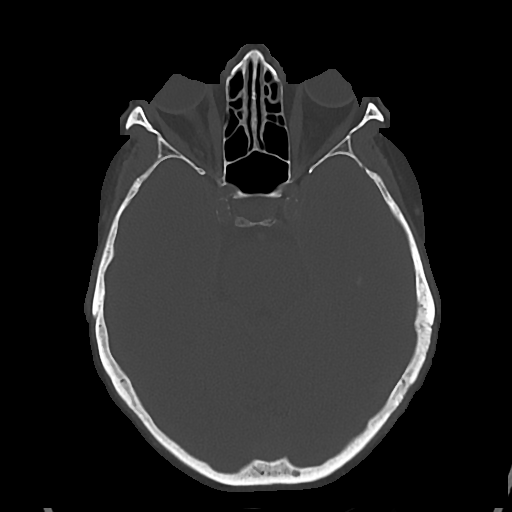

[Series 5: cor soft · coronal · 0.30mm/px · 3 of 65 slices shown]
[im 22/65  brain]
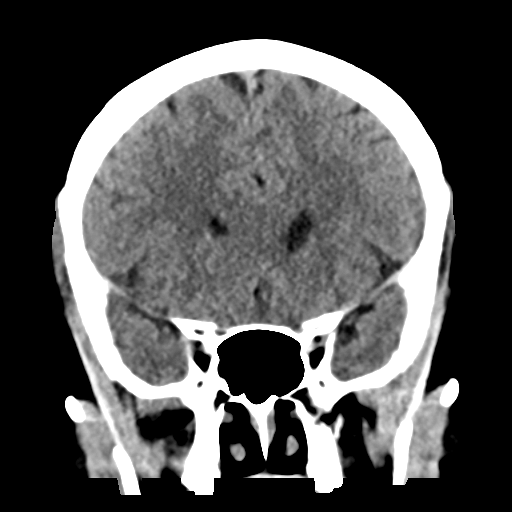
[im 29/65  brain]
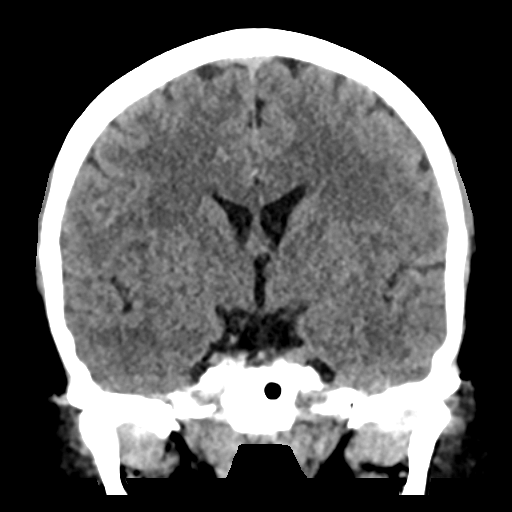
[im 36/65  brain]
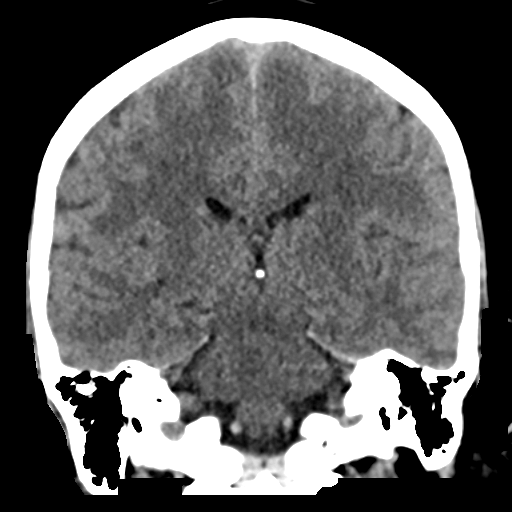

[Series 6: sag soft · sagittal · 0.30mm/px · 3 of 49 slices shown]
[im 17/49  brain]
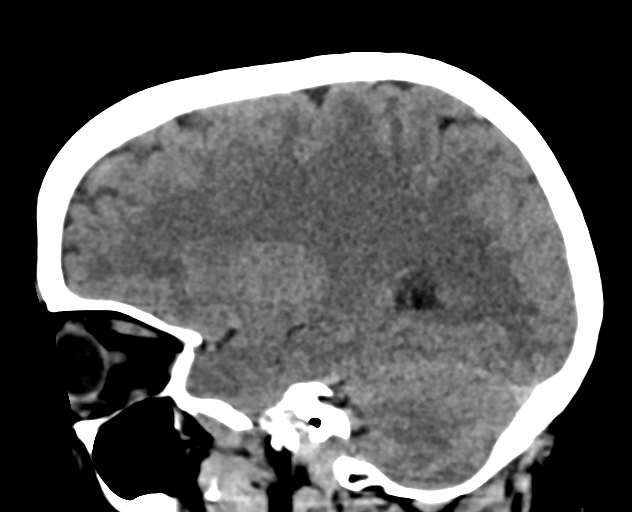
[im 25/49  brain]
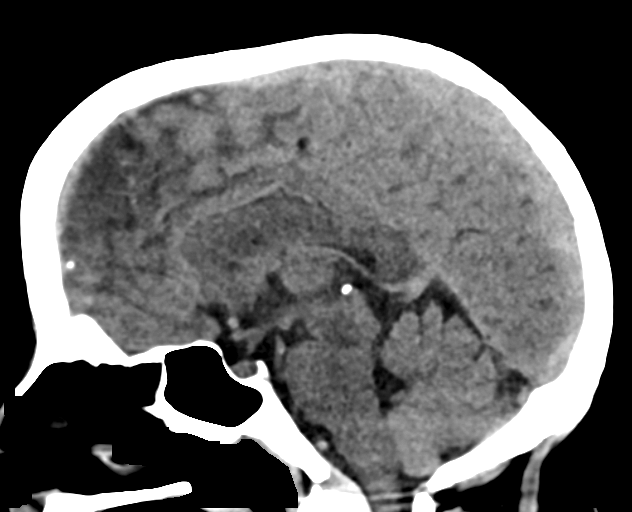
[im 33/49  brain]
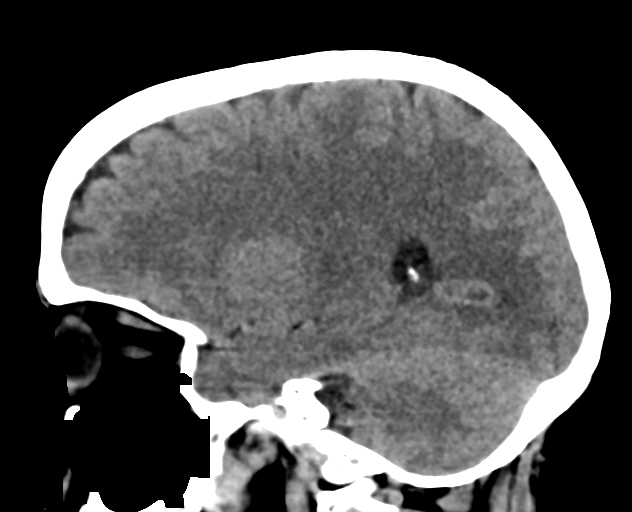

[16 of 47 positions shown; findings below may reference images not displayed]

FINDINGS: Brain: No evidence of acute infarction, hemorrhage, hydrocephalus,
extra-axial collection or mass lesion/mass effect.

The posterior fossa, including the cerebellum, brainstem and fourth
ventricle, is within normal limits. The third and lateral
ventricles, and basal ganglia are unremarkable in appearance. The
cerebral hemispheres are symmetric in appearance, with normal
gray-white differentiation. No mass effect or midline shift is seen.

Vascular: No hyperdense vessel or unexpected calcification.

Skull: There is no evidence of fracture; visualized osseous
structures are unremarkable in appearance.

Sinuses/Orbits: Mild proptosis is noted bilaterally. The orbits are
otherwise unremarkable. The paranasal sinuses and mastoid air cells
are well-aerated.

Other: No significant soft tissue abnormalities are seen.
IMPRESSION: 1. No acute intracranial pathology seen on CT.
2. Mild proptosis noted bilaterally.

## 2020-11-21 IMAGING — DX DG CHEST 1V
1 series · 1 of 1 positions shown · non-contrast
Comparison: 09/13/2017

CLINICAL DATA: Altered mental status

EXAM:
CHEST  1 VIEW

[chest ap]
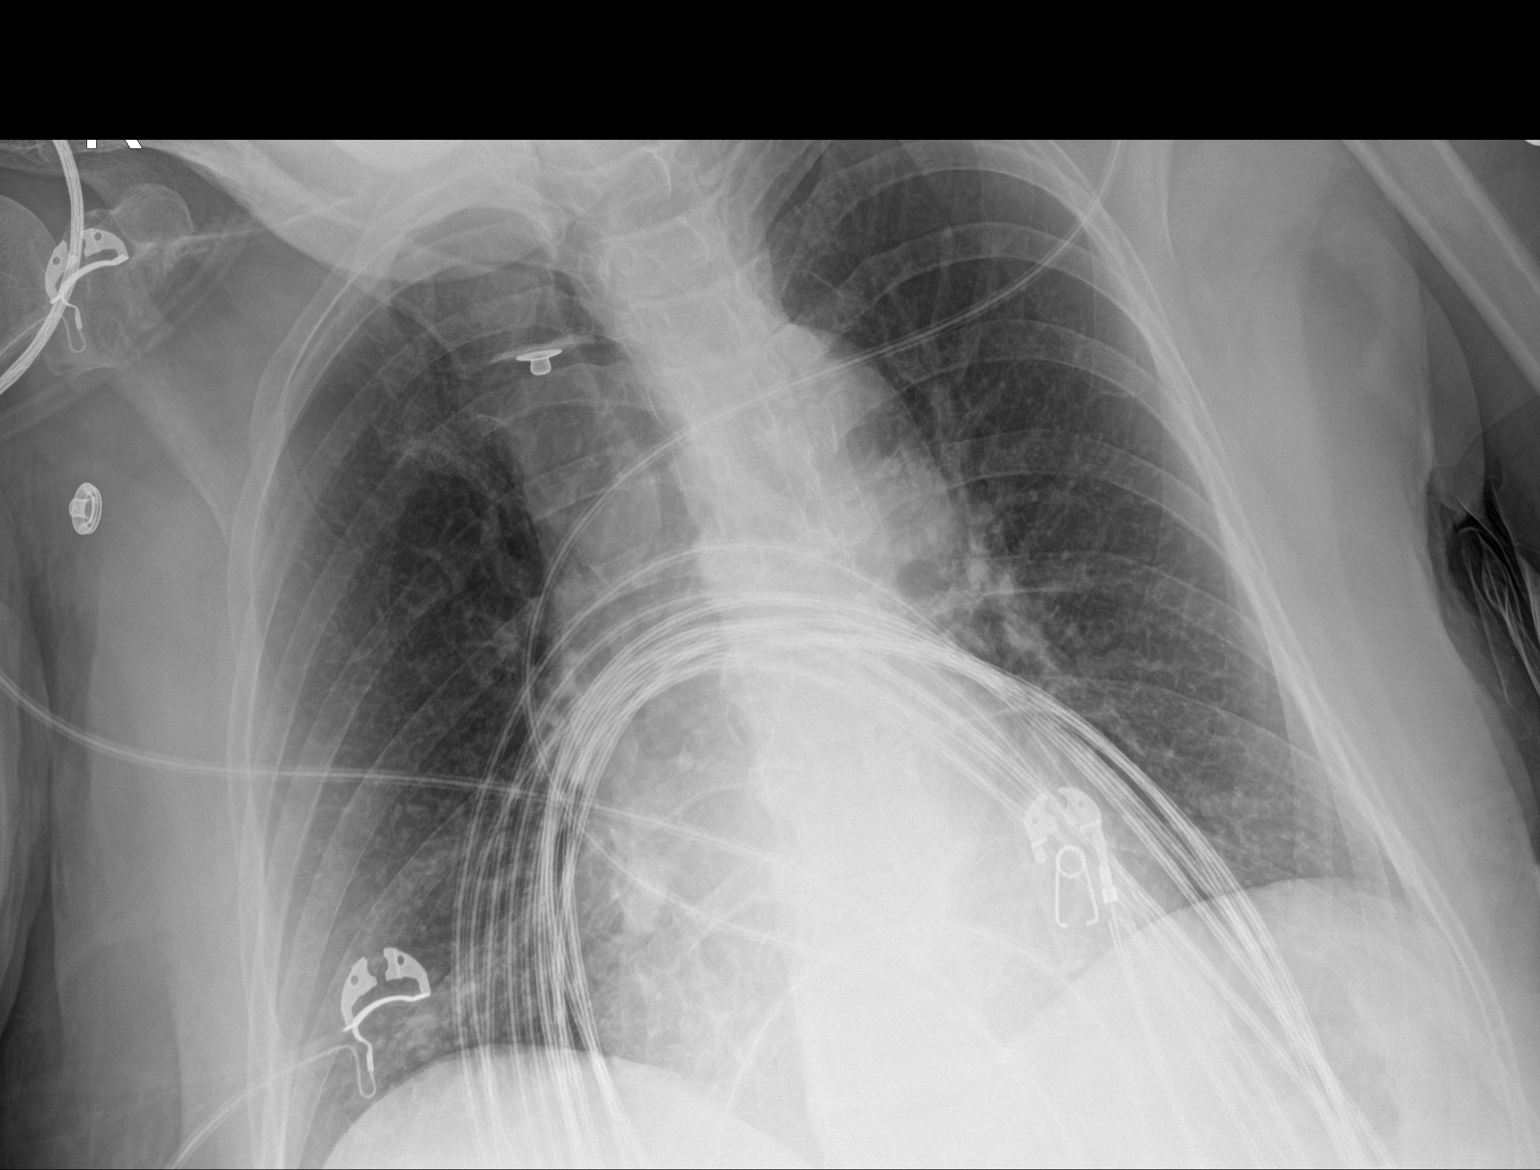

[1 of 1 positions shown; findings below may reference images not displayed]

FINDINGS: Heart is borderline in size. No confluent airspace opacities,
effusions or edema. No acute bony abnormality.
IMPRESSION: No active disease.

## 2022-03-15 DIAGNOSIS — R69 Illness, unspecified: Secondary | ICD-10-CM | POA: Diagnosis not present

## 2022-03-15 DIAGNOSIS — F419 Anxiety disorder, unspecified: Secondary | ICD-10-CM | POA: Diagnosis not present

## 2022-03-15 DIAGNOSIS — F3341 Major depressive disorder, recurrent, in partial remission: Secondary | ICD-10-CM | POA: Diagnosis not present

## 2022-04-05 DIAGNOSIS — R69 Illness, unspecified: Secondary | ICD-10-CM | POA: Diagnosis not present

## 2022-04-05 DIAGNOSIS — F3341 Major depressive disorder, recurrent, in partial remission: Secondary | ICD-10-CM | POA: Diagnosis not present

## 2022-04-05 DIAGNOSIS — F419 Anxiety disorder, unspecified: Secondary | ICD-10-CM | POA: Diagnosis not present

## 2022-04-14 DIAGNOSIS — I10 Essential (primary) hypertension: Secondary | ICD-10-CM | POA: Diagnosis not present

## 2022-04-14 DIAGNOSIS — E782 Mixed hyperlipidemia: Secondary | ICD-10-CM | POA: Diagnosis not present

## 2022-04-14 DIAGNOSIS — J4540 Moderate persistent asthma, uncomplicated: Secondary | ICD-10-CM | POA: Diagnosis not present

## 2022-04-14 DIAGNOSIS — I700 Atherosclerosis of aorta: Secondary | ICD-10-CM | POA: Diagnosis not present

## 2022-04-14 DIAGNOSIS — E559 Vitamin D deficiency, unspecified: Secondary | ICD-10-CM | POA: Diagnosis not present

## 2022-04-14 DIAGNOSIS — R69 Illness, unspecified: Secondary | ICD-10-CM | POA: Diagnosis not present

## 2022-04-14 DIAGNOSIS — Z0000 Encounter for general adult medical examination without abnormal findings: Secondary | ICD-10-CM | POA: Diagnosis not present

## 2022-04-14 DIAGNOSIS — Z9181 History of falling: Secondary | ICD-10-CM | POA: Diagnosis not present

## 2022-05-18 DIAGNOSIS — F3341 Major depressive disorder, recurrent, in partial remission: Secondary | ICD-10-CM | POA: Diagnosis not present

## 2022-05-18 DIAGNOSIS — R69 Illness, unspecified: Secondary | ICD-10-CM | POA: Diagnosis not present

## 2022-10-22 DIAGNOSIS — F3341 Major depressive disorder, recurrent, in partial remission: Secondary | ICD-10-CM | POA: Diagnosis not present

## 2022-10-22 DIAGNOSIS — E782 Mixed hyperlipidemia: Secondary | ICD-10-CM | POA: Diagnosis not present

## 2022-10-22 DIAGNOSIS — I110 Hypertensive heart disease with heart failure: Secondary | ICD-10-CM | POA: Diagnosis not present

## 2022-10-22 DIAGNOSIS — I5032 Chronic diastolic (congestive) heart failure: Secondary | ICD-10-CM | POA: Diagnosis not present

## 2022-10-22 DIAGNOSIS — I10 Essential (primary) hypertension: Secondary | ICD-10-CM | POA: Diagnosis not present

## 2022-10-22 DIAGNOSIS — J4540 Moderate persistent asthma, uncomplicated: Secondary | ICD-10-CM | POA: Diagnosis not present

## 2022-10-22 DIAGNOSIS — I700 Atherosclerosis of aorta: Secondary | ICD-10-CM | POA: Diagnosis not present

## 2023-09-07 DIAGNOSIS — Z23 Encounter for immunization: Secondary | ICD-10-CM | POA: Diagnosis not present

## 2023-09-07 DIAGNOSIS — I5032 Chronic diastolic (congestive) heart failure: Secondary | ICD-10-CM | POA: Diagnosis not present

## 2023-09-07 DIAGNOSIS — M1990 Unspecified osteoarthritis, unspecified site: Secondary | ICD-10-CM | POA: Diagnosis not present

## 2023-09-07 DIAGNOSIS — E2839 Other primary ovarian failure: Secondary | ICD-10-CM | POA: Diagnosis not present

## 2023-09-07 DIAGNOSIS — J4540 Moderate persistent asthma, uncomplicated: Secondary | ICD-10-CM | POA: Diagnosis not present

## 2023-09-07 DIAGNOSIS — I10 Essential (primary) hypertension: Secondary | ICD-10-CM | POA: Diagnosis not present

## 2023-09-07 DIAGNOSIS — E559 Vitamin D deficiency, unspecified: Secondary | ICD-10-CM | POA: Diagnosis not present

## 2023-09-07 DIAGNOSIS — I700 Atherosclerosis of aorta: Secondary | ICD-10-CM | POA: Diagnosis not present

## 2023-09-07 DIAGNOSIS — E782 Mixed hyperlipidemia: Secondary | ICD-10-CM | POA: Diagnosis not present

## 2023-09-07 DIAGNOSIS — J309 Allergic rhinitis, unspecified: Secondary | ICD-10-CM | POA: Diagnosis not present

## 2023-09-07 DIAGNOSIS — Z0000 Encounter for general adult medical examination without abnormal findings: Secondary | ICD-10-CM | POA: Diagnosis not present

## 2023-09-07 DIAGNOSIS — I350 Nonrheumatic aortic (valve) stenosis: Secondary | ICD-10-CM | POA: Diagnosis not present

## 2023-09-16 DIAGNOSIS — H81392 Other peripheral vertigo, left ear: Secondary | ICD-10-CM | POA: Diagnosis not present

## 2023-09-16 DIAGNOSIS — I10 Essential (primary) hypertension: Secondary | ICD-10-CM | POA: Diagnosis not present

## 2023-10-05 DIAGNOSIS — Z6829 Body mass index (BMI) 29.0-29.9, adult: Secondary | ICD-10-CM | POA: Diagnosis not present

## 2023-10-05 DIAGNOSIS — I700 Atherosclerosis of aorta: Secondary | ICD-10-CM | POA: Diagnosis not present

## 2023-10-05 DIAGNOSIS — J4540 Moderate persistent asthma, uncomplicated: Secondary | ICD-10-CM | POA: Diagnosis not present

## 2023-10-05 DIAGNOSIS — Z1231 Encounter for screening mammogram for malignant neoplasm of breast: Secondary | ICD-10-CM | POA: Diagnosis not present

## 2023-10-05 DIAGNOSIS — I10 Essential (primary) hypertension: Secondary | ICD-10-CM | POA: Diagnosis not present
